# Patient Record
Sex: Male | Born: 1945 | Race: White | Hispanic: No | Marital: Married | Smoking: Never smoker
Health system: Southern US, Community
[De-identification: ages and names within clinical notes are randomized; demographics above are authoritative.]

## PROBLEM LIST (undated history)

## (undated) DIAGNOSIS — E291 Testicular hypofunction: Secondary | ICD-10-CM

## (undated) DIAGNOSIS — E669 Obesity, unspecified: Secondary | ICD-10-CM

## (undated) DIAGNOSIS — K219 Gastro-esophageal reflux disease without esophagitis: Secondary | ICD-10-CM

## (undated) DIAGNOSIS — I340 Nonrheumatic mitral (valve) insufficiency: Secondary | ICD-10-CM

## (undated) DIAGNOSIS — F1010 Alcohol abuse, uncomplicated: Secondary | ICD-10-CM

## (undated) DIAGNOSIS — K635 Polyp of colon: Secondary | ICD-10-CM

## (undated) DIAGNOSIS — I499 Cardiac arrhythmia, unspecified: Secondary | ICD-10-CM

## (undated) DIAGNOSIS — N529 Male erectile dysfunction, unspecified: Secondary | ICD-10-CM

## (undated) DIAGNOSIS — E7800 Pure hypercholesterolemia, unspecified: Secondary | ICD-10-CM

## (undated) DIAGNOSIS — I8390 Asymptomatic varicose veins of unspecified lower extremity: Secondary | ICD-10-CM

## (undated) DIAGNOSIS — M1990 Unspecified osteoarthritis, unspecified site: Secondary | ICD-10-CM

## (undated) DIAGNOSIS — K2970 Gastritis, unspecified, without bleeding: Secondary | ICD-10-CM

## (undated) DIAGNOSIS — I4811 Longstanding persistent atrial fibrillation: Secondary | ICD-10-CM

## (undated) DIAGNOSIS — I739 Peripheral vascular disease, unspecified: Secondary | ICD-10-CM

## (undated) HISTORY — PX: ENDOVENOUS ABLATION SAPHENOUS VEIN W/ LASER: SUR449

## (undated) HISTORY — DX: Gastro-esophageal reflux disease without esophagitis: K21.9

## (undated) HISTORY — PX: COLONOSCOPY: SHX174

## (undated) HISTORY — DX: Nonrheumatic mitral (valve) insufficiency: I34.0

## (undated) HISTORY — DX: Male erectile dysfunction, unspecified: N52.9

## (undated) HISTORY — DX: Pure hypercholesterolemia, unspecified: E78.00

## (undated) HISTORY — DX: Asymptomatic varicose veins of unspecified lower extremity: I83.90

## (undated) HISTORY — PX: CATARACT EXTRACTION: SUR2

## (undated) HISTORY — PX: EYE SURGERY: SHX253

## (undated) HISTORY — PX: OTHER SURGICAL HISTORY: SHX169

## (undated) HISTORY — DX: Longstanding persistent atrial fibrillation: I48.11

## (undated) HISTORY — PX: HERNIA REPAIR: SHX51

## (undated) SURGERY — Surgical Case
Anesthesia: *Unknown

---

## 2009-01-15 ENCOUNTER — Ambulatory Visit: Payer: Self-pay | Admitting: Gastroenterology

## 2010-11-12 DIAGNOSIS — K219 Gastro-esophageal reflux disease without esophagitis: Secondary | ICD-10-CM | POA: Insufficient documentation

## 2010-11-12 DIAGNOSIS — I482 Chronic atrial fibrillation, unspecified: Secondary | ICD-10-CM | POA: Insufficient documentation

## 2010-11-12 DIAGNOSIS — N529 Male erectile dysfunction, unspecified: Secondary | ICD-10-CM | POA: Insufficient documentation

## 2010-11-12 DIAGNOSIS — I340 Nonrheumatic mitral (valve) insufficiency: Secondary | ICD-10-CM | POA: Insufficient documentation

## 2010-11-12 DIAGNOSIS — E669 Obesity, unspecified: Secondary | ICD-10-CM | POA: Insufficient documentation

## 2010-11-12 DIAGNOSIS — M1990 Unspecified osteoarthritis, unspecified site: Secondary | ICD-10-CM | POA: Insufficient documentation

## 2010-11-12 DIAGNOSIS — I872 Venous insufficiency (chronic) (peripheral): Secondary | ICD-10-CM | POA: Insufficient documentation

## 2010-11-13 ENCOUNTER — Encounter: Payer: Self-pay | Admitting: *Deleted

## 2010-11-13 DIAGNOSIS — K219 Gastro-esophageal reflux disease without esophagitis: Secondary | ICD-10-CM | POA: Insufficient documentation

## 2010-11-13 DIAGNOSIS — I4819 Other persistent atrial fibrillation: Secondary | ICD-10-CM | POA: Insufficient documentation

## 2010-11-13 DIAGNOSIS — I83819 Varicose veins of unspecified lower extremities with pain: Secondary | ICD-10-CM | POA: Insufficient documentation

## 2010-11-13 DIAGNOSIS — E7800 Pure hypercholesterolemia, unspecified: Secondary | ICD-10-CM | POA: Insufficient documentation

## 2010-11-13 DIAGNOSIS — I340 Nonrheumatic mitral (valve) insufficiency: Secondary | ICD-10-CM | POA: Insufficient documentation

## 2010-11-13 DIAGNOSIS — N529 Male erectile dysfunction, unspecified: Secondary | ICD-10-CM | POA: Insufficient documentation

## 2010-12-10 DIAGNOSIS — E291 Testicular hypofunction: Secondary | ICD-10-CM | POA: Insufficient documentation

## 2010-12-11 ENCOUNTER — Ambulatory Visit (INDEPENDENT_AMBULATORY_CARE_PROVIDER_SITE_OTHER): Payer: Self-pay | Admitting: Cardiovascular Disease

## 2010-12-11 ENCOUNTER — Telehealth: Payer: Self-pay | Admitting: Cardiovascular Disease

## 2010-12-11 ENCOUNTER — Encounter: Payer: Self-pay | Admitting: Cardiovascular Disease

## 2010-12-11 ENCOUNTER — Other Ambulatory Visit: Payer: Self-pay | Admitting: *Deleted

## 2010-12-11 DIAGNOSIS — E7800 Pure hypercholesterolemia, unspecified: Secondary | ICD-10-CM

## 2010-12-11 DIAGNOSIS — I4891 Unspecified atrial fibrillation: Secondary | ICD-10-CM

## 2010-12-11 DIAGNOSIS — I480 Paroxysmal atrial fibrillation: Secondary | ICD-10-CM

## 2010-12-11 LAB — LIPID PANEL
HDL: 55.8 mg/dL (ref >39.00)
Total CHOL/HDL Ratio: 3
Triglycerides: 69 mg/dL (ref 0.0–149.0)
VLDL: 13.8 mg/dL (ref 0.0–40.0)

## 2010-12-11 LAB — BASIC METABOLIC PANEL
CO2: 24 mEq/L (ref 19–32)
Calcium: 9.1 mg/dL (ref 8.4–10.5)
Chloride: 106 mEq/L (ref 96–112)
Potassium: 4 mEq/L (ref 3.5–5.1)
Sodium: 142 mEq/L (ref 135–145)

## 2010-12-11 LAB — HEPATIC FUNCTION PANEL
ALT: 32 U/L (ref 0–53)
AST: 24 U/L (ref 0–37)
Albumin: 3.7 g/dL (ref 3.5–5.2)
Alkaline Phosphatase: 75 U/L (ref 39–117)
Bilirubin, Direct: 0.1 mg/dL (ref 0.0–0.3)
Total Bilirubin: 0.9 mg/dL (ref 0.3–1.2)
Total Protein: 6.7 g/dL (ref 6.0–8.3)

## 2010-12-11 MED ORDER — ATORVASTATIN CALCIUM 40 MG PO TABS: 40.0000 mg | ORAL_TABLET | Freq: Every day | ORAL | Status: DC

## 2010-12-11 MED ORDER — METOPROLOL SUCCINATE ER 50 MG PO TB24: 50.0000 mg | ORAL_TABLET | Freq: Every day | ORAL | Status: DC

## 2010-12-11 MED ORDER — PROPRANOLOL HCL 10 MG PO TABS: 10.0000 mg | ORAL_TABLET | Freq: Three times a day (TID) | ORAL | Status: DC

## 2010-12-11 NOTE — Telephone Encounter (Signed)
Pt called. He said he was here this morning and was calling back The meds are:  Atorvastatin 40 mg he has 11 refills and is ok until 10/2011  Toprol 100mg  he said the mg has been reduced to 50 mg please verify He said he needs a year supply called to [facility]

## 2010-12-11 NOTE — Telephone Encounter (Signed)
Spoke with pt/ meds ordered.

## 2010-12-11 NOTE — Progress Notes (Signed)
Louis Fuller Date of Birth  1945/07/26 [facility] [address]    [address] [place], [place]  [address] [phone]  Fax  [phone]  History of Present Illness:  Louis Fuller is a 65 y.o. gentleman with a Hx of paroxysmal Atrial Fibrillation.  He notices it significant heart irregularities after he comes in from own grass or doing other yard work. He takes an extra half of of Toprol that seemed to help. He gets some extra exercise  Current Outpatient Prescriptions on File Prior to Visit  Medication Sig Dispense Refill  . meloxicam (MOBIC) 15 MG tablet Take 15 mg by mouth daily.        . metoprolol (TOPROL-XL) 50 MG 24 hr tablet Take 50 mg by mouth daily.        . Multiple Vitamin (MULTIVITAMIN) tablet Take 1 tablet by mouth daily.        [name] Kitchen omeprazole (PRILOSEC) 20 MG capsule Take 20 mg by mouth daily.        . Atorvastatin 20 mg  20 mg daily      . Tadalafil (CIALIS PO) Take by mouth. As needed         No Known Allergies  Past Medical History  Diagnosis Date  . Intermittent atrial fibrillation   . Mitral regurgitation   . Varicose veins   . ED (erectile dysfunction)   . GERD (gastroesophageal reflux disease)   . Hypercholesterolemia     No past surgical history on file.  History  Smoking status  . Never Smoker   Smokeless tobacco  . Not on file    History  Alcohol Use  . 0.0 oz/week    No family history on file.  Reviw of Systems:  Reviewed in the HPI.  All other systems are negative.  Physical Exam: BP 118/85  Pulse 68  Ht 5\' 11"  (1.803 m)  Wt 267 lb (121.11 kg)  BMI 37.24 kg/m2 The patient is alert and oriented x 3.  The mood and affect are normal.   Skin: warm and dry.  Color is normal.    HEENT:   [place]/ AT, normal carotids  Lungs: clear   Heart: Regular rate, occasional PVCs    Abdomen: + BS, no HSM, soft  Extremities:  No c/c/e  Neuro:  Non focal    ECG: NSR, occasional PVCs  Assessment / Plan:

## 2010-12-11 NOTE — Assessment & Plan Note (Addendum)
He seems to be feeling very well. He still has occasional episodes of palpitations which are either due to premature ventricular contractions or recent episodes of atrial fibrillation. We will prescribe him propranolol 10 mg to be taken on top of his Toprol-XL. He may take propranolol up to 4 times a day as needed.

## 2010-12-11 NOTE — Patient Instructions (Addendum)
Your physician wants you to follow-up in: 1 year You will receive a reminder letter in the mail two months in advance. If you don't receive a letter, please call our office to schedule the follow-up appointment.  Your physician recommends that you return for a FASTING lipid profile: 1 year and today  Your physician has recommended you make the following change in your medication:   1) use propranolol 10 mg for palpitations, may take one every 30 minutes up to 4 doses, side effect is low blood pressure/ dizziness.    CALL ME BACK @ [phone] AND UPDATE ME WITH YOUR CHOLESTEROL MED, [name] rn

## 2010-12-11 NOTE — Assessment & Plan Note (Signed)
We'll check fasting lab work today and again in one year when I see him again for an office visit.

## 2010-12-13 ENCOUNTER — Other Ambulatory Visit: Payer: Self-pay | Admitting: *Deleted

## 2011-08-21 ENCOUNTER — Telehealth: Payer: Self-pay | Admitting: Cardiovascular Disease

## 2011-08-21 NOTE — Telephone Encounter (Signed)
Patient is due for f/u in Nov. Called to see if he would like to come to the [place] office instead of [place] since he lives in [place].

## 2011-11-07 ENCOUNTER — Other Ambulatory Visit: Payer: Self-pay | Admitting: Cardiovascular Disease

## 2011-12-01 ENCOUNTER — Other Ambulatory Visit: Payer: Self-pay | Admitting: *Deleted

## 2011-12-01 MED ORDER — ATORVASTATIN CALCIUM 40 MG PO TABS: 40.0000 mg | ORAL_TABLET | Freq: Every day | ORAL | Status: DC

## 2011-12-01 NOTE — Telephone Encounter (Signed)
Fax Received. Refill Completed. Adelayde Minney Chowoe (R.M.A)   

## 2011-12-02 ENCOUNTER — Other Ambulatory Visit: Payer: Self-pay | Admitting: *Deleted

## 2011-12-02 NOTE — Telephone Encounter (Signed)
Opened in Error.

## 2011-12-08 ENCOUNTER — Telehealth: Payer: Self-pay | Admitting: Cardiovascular Disease

## 2011-12-08 NOTE — Telephone Encounter (Signed)
Pt needs refill of his cholesterol med, [facility] [address] [place] , they requested 8 days ago with no response, pt has one pill left, needs refill asap today

## 2011-12-09 MED ORDER — ATORVASTATIN CALCIUM 40 MG PO TABS: 40.0000 mg | ORAL_TABLET | Freq: Every day | ORAL | Status: DC

## 2011-12-09 NOTE — Telephone Encounter (Signed)
Pt needs appointment then refill can be made Fax Received. Refill Completed. Lynetta Tomczak Chowoe (R.M.A)   

## 2012-01-16 ENCOUNTER — Other Ambulatory Visit: Payer: Self-pay | Admitting: *Deleted

## 2012-02-05 ENCOUNTER — Other Ambulatory Visit: Payer: Self-pay | Admitting: *Deleted

## 2012-02-05 MED ORDER — ATORVASTATIN CALCIUM 40 MG PO TABS: 40.0000 mg | ORAL_TABLET | Freq: Every day | ORAL | Status: DC

## 2012-02-05 NOTE — Telephone Encounter (Signed)
NO REFILLS UNTIL APPOINTMENT Fax Received. Refill Completed. Louis Fuller (R.M.A)   

## 2012-02-17 ENCOUNTER — Ambulatory Visit: Payer: Commercial Indemnity | Admitting: Cardiovascular Disease

## 2012-03-04 ENCOUNTER — Ambulatory Visit (INDEPENDENT_AMBULATORY_CARE_PROVIDER_SITE_OTHER): Payer: PRIVATE HEALTH INSURANCE | Admitting: Cardiovascular Disease

## 2012-03-04 ENCOUNTER — Encounter: Payer: Self-pay | Admitting: Cardiovascular Disease

## 2012-03-04 VITALS — BP 145/77 | HR 65 | Ht 71.0 in | Wt 276.5 lb

## 2012-03-04 DIAGNOSIS — E7800 Pure hypercholesterolemia, unspecified: Secondary | ICD-10-CM

## 2012-03-04 DIAGNOSIS — I4891 Unspecified atrial fibrillation: Secondary | ICD-10-CM

## 2012-03-04 DIAGNOSIS — I480 Paroxysmal atrial fibrillation: Secondary | ICD-10-CM

## 2012-03-04 MED ORDER — METOPROLOL SUCCINATE ER 50 MG PO TB24: 50.0000 mg | ORAL_TABLET | Freq: Every day | ORAL | Status: DC

## 2012-03-04 MED ORDER — ATORVASTATIN CALCIUM 40 MG PO TABS: 40.0000 mg | ORAL_TABLET | Freq: Every day | ORAL | Status: DC

## 2012-03-04 NOTE — Assessment & Plan Note (Signed)
Felicity Pellegrini continues to have some occasional episodes of paroxysmal atrial fibrillation. He seemed to resolve fairly quickly especially if he takes propranolol. We'll continue with this strategy.

## 2012-03-04 NOTE — Assessment & Plan Note (Signed)
We'll check fasting labs tomorrow. I've encouraged him to work more on a good diet and exercise and weight loss program. He will continue with his current medications.

## 2012-03-04 NOTE — Patient Instructions (Addendum)
Your physician wants you to follow-up in: 1 year with Dr. [name]. You will receive a reminder letter in the mail two months in advance. If you don't receive a letter, please call our office to schedule the follow-up appointment.  Your physician recommends that you return for lab work in: tomorrow. Make sure you are fasting.

## 2012-03-04 NOTE — Progress Notes (Addendum)
Louis Fuller Date of Birth  1946/01/08 [facility] [address]    [address] [place], [place]  [address] [phone]  Fax  [phone]  Problem list: 1. Paroxysmal atrial fibrillation 2. Hypertension 3. Hyperlipidemia 4. Knee arthritis - needs knee replacement  History of Present Illness:  Louis Fuller is a 66 y.o. gentleman with a Hx of paroxysmal Atrial Fibrillation.  He notices it significant heart irregularities after he comes in from own grass or doing other yard work. He takes an extra half of of Toprol that seemed to help. He gets some extra exercise  March 04, 2012:  Louis Fuller is doing well.  He has some rare episodes of palpitations that clinically sound like atrial fib.  The episode lasted most of the night.   He takes propranolol on occasion for these episodes of A-Fib.  Current Outpatient Prescriptions on File Prior to Visit  Medication Sig Dispense Refill  . meloxicam (MOBIC) 15 MG tablet Take 15 mg by mouth daily.        . metoprolol (TOPROL-XL) 50 MG 24 hr tablet Take 50 mg by mouth daily.        . Multiple Vitamin (MULTIVITAMIN) tablet Take 1 tablet by mouth daily.        [name] Kitchen omeprazole (PRILOSEC) 20 MG capsule Take 20 mg by mouth daily.        . Atorvastatin 20 mg  20 mg daily      . Tadalafil (CIALIS PO) Take by mouth. As needed         No Known Allergies  Past Medical History  Diagnosis Date  . Intermittent atrial fibrillation   . Mitral regurgitation   . Varicose veins   . ED (erectile dysfunction)   . GERD (gastroesophageal reflux disease)   . Hypercholesterolemia     Past Surgical History  Procedure Date  . Cataract extraction     left    History  Smoking status  . Never Smoker   Smokeless tobacco  . Not on file    History  Alcohol Use  . 0.0 oz/week    History reviewed. No pertinent family history.  Reviw of Systems:  Reviewed in the HPI.  All other systems are negative.  Physical Exam: BP 145/77  Pulse 65  Ht 5\' 11"  (1.803  m)  Wt 276 lb 8 oz (125.42 kg)  BMI 38.56 kg/m2 The patient is alert and oriented x 3.  The mood and affect are normal.   Skin: warm and dry.  Color is normal.    HEENT:   [place]/ AT, normal carotids  Lungs: clear   Heart: Regular rate, occasional PVCs    Abdomen: + BS, no HSM, soft  Extremities:  No c/c/e  Neuro:  Non focal    ECG: FEb. 6, 2014:  NSR at 64, NS ST abnormalities in inferior leads.  Assessment / Plan:

## 2012-03-05 ENCOUNTER — Ambulatory Visit (INDEPENDENT_AMBULATORY_CARE_PROVIDER_SITE_OTHER): Payer: PRIVATE HEALTH INSURANCE

## 2012-03-05 DIAGNOSIS — I480 Paroxysmal atrial fibrillation: Secondary | ICD-10-CM

## 2012-03-05 DIAGNOSIS — I4891 Unspecified atrial fibrillation: Secondary | ICD-10-CM

## 2012-03-06 LAB — BASIC METABOLIC PANEL
Calcium: 9.1 mg/dL (ref 8.6–10.2)
Chloride: 103 mmol/L (ref 97–108)
GFR calc non Af Amer: 88 mL/min/{1.73_m2} (ref >59)
Potassium: 4.6 mmol/L (ref 3.5–5.2)
Sodium: 142 mmol/L (ref 134–144)

## 2012-03-06 LAB — LIPID PANEL
Cholesterol, Total: 162 mg/dL (ref 100–199)
Triglycerides: 94 mg/dL (ref 0–149)

## 2012-03-06 LAB — HEPATIC FUNCTION PANEL
ALT: 37 IU/L (ref 0–44)
AST: 23 IU/L (ref 0–40)
Alkaline Phosphatase: 93 IU/L (ref 39–117)
Bilirubin, Direct: 0.15 mg/dL (ref 0.00–0.40)
Total Bilirubin: 0.5 mg/dL (ref 0.0–1.2)

## 2012-03-17 ENCOUNTER — Other Ambulatory Visit: Payer: Self-pay | Admitting: *Deleted

## 2012-03-17 NOTE — Telephone Encounter (Signed)
[facility]ro[facility]ol [facility]

## 2012-03-19 ENCOUNTER — Other Ambulatory Visit: Payer: Self-pay

## 2012-03-19 MED ORDER — METOPROLOL SUCCINATE ER 50 MG PO TB24: ORAL_TABLET | ORAL | Status: DC

## 2012-06-04 ENCOUNTER — Other Ambulatory Visit: Payer: Self-pay | Admitting: *Deleted

## 2012-06-04 MED ORDER — ATORVASTATIN CALCIUM 40 MG PO TABS: 40.0000 mg | ORAL_TABLET | Freq: Every day | ORAL | Status: DC

## 2012-06-04 NOTE — Telephone Encounter (Signed)
Fax Received. Refill Completed. Louis Fuller (R.M.A)   

## 2012-06-14 DIAGNOSIS — E785 Hyperlipidemia, unspecified: Secondary | ICD-10-CM | POA: Insufficient documentation

## 2012-09-24 ENCOUNTER — Ambulatory Visit: Payer: Self-pay | Admitting: Orthopedic Surgery

## 2012-11-30 ENCOUNTER — Other Ambulatory Visit: Payer: Self-pay

## 2012-11-30 MED ORDER — ATORVASTATIN CALCIUM 40 MG PO TABS: 40.0000 mg | ORAL_TABLET | Freq: Every day | ORAL | Status: DC

## 2013-02-21 ENCOUNTER — Other Ambulatory Visit: Payer: Self-pay | Admitting: Cardiovascular Disease

## 2013-05-31 ENCOUNTER — Other Ambulatory Visit: Payer: Self-pay

## 2013-05-31 MED ORDER — ATORVASTATIN CALCIUM 40 MG PO TABS: 40.0000 mg | ORAL_TABLET | Freq: Every day | ORAL | Status: DC

## 2013-06-22 ENCOUNTER — Other Ambulatory Visit: Payer: Self-pay | Admitting: *Deleted

## 2013-06-22 MED ORDER — ATORVASTATIN CALCIUM 40 MG PO TABS: 40.0000 mg | ORAL_TABLET | Freq: Every day | ORAL | Status: DC

## 2013-06-22 NOTE — Telephone Encounter (Signed)
Patient made an appt. Needs a refill

## 2013-07-05 ENCOUNTER — Encounter: Payer: Self-pay | Admitting: Cardiovascular Disease

## 2013-07-05 ENCOUNTER — Encounter (INDEPENDENT_AMBULATORY_CARE_PROVIDER_SITE_OTHER): Payer: Self-pay

## 2013-07-05 ENCOUNTER — Ambulatory Visit (INDEPENDENT_AMBULATORY_CARE_PROVIDER_SITE_OTHER): Payer: Medicare Other | Admitting: Cardiovascular Disease

## 2013-07-05 VITALS — BP 118/90 | HR 101 | Ht 71.0 in | Wt 275.5 lb

## 2013-07-05 DIAGNOSIS — I4891 Unspecified atrial fibrillation: Secondary | ICD-10-CM

## 2013-07-05 DIAGNOSIS — R0602 Shortness of breath: Secondary | ICD-10-CM

## 2013-07-05 DIAGNOSIS — I480 Paroxysmal atrial fibrillation: Secondary | ICD-10-CM

## 2013-07-05 NOTE — Assessment & Plan Note (Addendum)
Louis Fuller Presents with atrial fibrillation today. He's having a little bit more shortness breath and has some ankle edema. Her worried that he may be having some palpitations related to his atrial fibrillation. He's had PAF for many years.  We will get an echocardiogram to assess his LV function.  Had long discussion about anticoagulation. We'll start him on Eliquis 5 bid.  I've given him the names of all of the different Novel anticoagulation agents and he will see which one his insurance company prefers.  We'll continue with rate control and anticoagulation for now. Will need to do a stress Myoview study before starting him on any additional medications. He may be a candidate for flecainide. I seen again in one month for followup office visit and EKG.

## 2013-07-05 NOTE — Progress Notes (Signed)
Louis Fuller Date of Birth  1945-03-03 [facility] [address]    [place], [place]  [address] [phone]  Fax  [phone]  Problem list: 1. Paroxysmal atrial fibrillation 2. Hypertension 3. Hyperlipidemia 4. Knee arthritis - needs knee replacement  History of Present Illness:  Louis Fuller is a 68 y.o. gentleman with a Hx of paroxysmal Atrial Fibrillation.  He notices it significant heart irregularities after he comes in from own grass or doing other yard work. He takes an extra half of of Toprol that seemed to help. He gets some extra exercise  March 04, 2012:  Louis Fuller is doing well.  He has some rare episodes of palpitations that clinically sound like atrial fib.  The episode lasted most of the night.   He takes propranolol on occasion for these episodes of A-Fib.  July 05, 2013:  He has moved to [place] since I last saw him.    He needs to have knee replacements.   He has vericose veins in his legs and has leg swelling.    No CP, no dyspnea.   He is in A-fib today at his office visit.   Current Outpatient Prescriptions on File Prior to Visit  Medication Sig Dispense Refill  . meloxicam (MOBIC) 15 MG tablet Take 15 mg by mouth daily.        . metoprolol (TOPROL-XL) 50 MG 24 hr tablet Take 50 mg by mouth daily.        . Multiple Vitamin (MULTIVITAMIN) tablet Take 1 tablet by mouth daily.        [name] Kitchen omeprazole (PRILOSEC) 20 MG capsule Take 20 mg by mouth daily.        . Atorvastatin 20 mg  20 mg daily      . Tadalafil (CIALIS PO) Take by mouth. As needed         No Known Allergies  Past Medical History  Diagnosis Date  . Intermittent atrial fibrillation   . Mitral regurgitation   . Varicose veins   . ED (erectile dysfunction)   . GERD (gastroesophageal reflux disease)   . Hypercholesterolemia     Past Surgical History  Procedure Laterality Date  . Cataract extraction      left  . Vein laser surgery      History  Smoking status  . Never  Smoker   Smokeless tobacco  . Not on file    History  Alcohol Use  . 0.0 oz/week    Family History  Problem Relation Age of Onset  . Family history unknown: Yes    Reviw of Systems:  Reviewed in the HPI.  All other systems are negative.  Physical Exam: BP 118/90  Pulse 101  Ht 5\' 11"  (1.803 m)  Wt 275 lb 8 oz (124.966 kg)  BMI 38.44 kg/m2 The patient is alert and oriented x 3.  The mood and affect are normal.   Skin: warm and dry.  Color is normal.    HEENT:   [place]/ AT, normal carotids  Lungs: clear   Heart: irreg. Irreg. , occasional PVCs    Abdomen: + BS, no HSM, soft  Extremities:  No c/c/e  Neuro:  Non focal    ECG: FEb. 6, 2014:  NSR at 64, NS ST abnormalities in inferior leads.  Assessment / Plan:

## 2013-07-05 NOTE — Patient Instructions (Addendum)
Check the price of the following anticoagulants:  Pradaxa 150 mg twice a day Xarelto 20 mg a day Eliquis 5 mg twice a day Savaysa 60 mg a day.    Eliquis 5 mg take one twice a day  Samples given   Your physician has requested that you have an echocardiogram. Echocardiography is a painless test that uses sound waves to create images of your heart. It provides your doctor with information about the size and shape of your heart and how well your heart's chambers and valves are working. This procedure takes approximately one hour. There are no restrictions for this procedure.  Your physician recommends that you schedule a follow-up appointment in: 1 month with a EKG

## 2013-07-06 ENCOUNTER — Telehealth: Payer: Self-pay

## 2013-07-06 DIAGNOSIS — R0602 Shortness of breath: Secondary | ICD-10-CM

## 2013-07-06 DIAGNOSIS — I480 Paroxysmal atrial fibrillation: Secondary | ICD-10-CM

## 2013-07-06 NOTE — Telephone Encounter (Signed)
Pt calling back to let know that blood thinners covered by ins are Eliquis, Savaysa, and Xarelto. Please call. States [facility] is a "little bit cheaper, and Eliquis he would have to take twice a day, not sure if he would remember to take this twice a day. " [facility].

## 2013-07-07 NOTE — Telephone Encounter (Signed)
I have calculated his creatinine clearance and his renal function is so good that his creatinine clearance exceeds the limit for Savaysa.  ( should not be used with CrCl over 95)   Would use Eliquis 5 mg BID

## 2013-07-08 MED ORDER — APIXABAN 5 MG PO TABS
5.0000 mg | ORAL_TABLET | Freq: Two times a day (BID) | ORAL | Status: DC
Start: 2013-07-08 — End: 2014-02-09

## 2013-07-08 NOTE — Telephone Encounter (Signed)
Patient has decided to take the Eliquis 5 mg BID  New RX sent to mail order pharmacy  Discount cards left at front desk  Aspirin stopped per Dr. Acie Fredrickson

## 2013-07-21 ENCOUNTER — Other Ambulatory Visit (INDEPENDENT_AMBULATORY_CARE_PROVIDER_SITE_OTHER): Payer: Medicare Other

## 2013-07-21 ENCOUNTER — Other Ambulatory Visit: Payer: Self-pay

## 2013-07-21 DIAGNOSIS — I480 Paroxysmal atrial fibrillation: Secondary | ICD-10-CM

## 2013-07-21 DIAGNOSIS — I4891 Unspecified atrial fibrillation: Secondary | ICD-10-CM

## 2013-07-21 DIAGNOSIS — R0602 Shortness of breath: Secondary | ICD-10-CM

## 2013-07-25 ENCOUNTER — Telehealth: Payer: Self-pay

## 2013-07-25 NOTE — Telephone Encounter (Signed)
Reviewed results w/ pt.  He verbalizes understanding and will keep appt on 08/03/13 w/ Dr. Acie Fredrickson.

## 2013-07-25 NOTE — Telephone Encounter (Signed)
Notes Recorded by Thayer Headings, MD on 07/22/2013 at 2:28 PM Overall , the echo looks great. Trivial valve issues ( nothing to worry about). Normal LV function              Results 2D Echocardiogram without contrast (Order [id number])         2D Echocardiogram without contrast   Status: Final result Visible to patient: This result is not viewable by the patient. Next appt: 08/03/2013 at 11:15 AM in Cardiology Darden Amber., MD) Dx: SOB (shortness of breath); Intermitte...         Notes Recorded by Thayer Headings, MD on 07/22/2013 at 2:28 PM Overall , the echo looks great. Trivial valve issues ( nothing to worry about). Normal LV function      Can you please review Echo and contact patient. Thanks!

## 2013-07-25 NOTE — Progress Notes (Signed)
Please contact patient regarding Echo.

## 2013-08-03 ENCOUNTER — Ambulatory Visit (INDEPENDENT_AMBULATORY_CARE_PROVIDER_SITE_OTHER): Payer: Medicare Other | Admitting: Cardiovascular Disease

## 2013-08-03 ENCOUNTER — Encounter: Payer: Self-pay | Admitting: Cardiovascular Disease

## 2013-08-03 VITALS — BP 124/102 | HR 88 | Ht 71.0 in | Wt 274.8 lb

## 2013-08-03 DIAGNOSIS — I480 Paroxysmal atrial fibrillation: Secondary | ICD-10-CM

## 2013-08-03 DIAGNOSIS — I4891 Unspecified atrial fibrillation: Secondary | ICD-10-CM

## 2013-08-03 MED ORDER — PROPRANOLOL HCL 10 MG PO TABS: ORAL_TABLET | ORAL | Status: DC

## 2013-08-03 MED ORDER — FLECAINIDE ACETATE 50 MG PO TABS: 50.0000 mg | ORAL_TABLET | Freq: Two times a day (BID) | ORAL | Status: DC

## 2013-08-03 NOTE — Patient Instructions (Addendum)
Louis Fuller  Your caregiver has ordered a Stress Test with nuclear imaging. The purpose of this test is to evaluate the blood supply to your heart muscle. This procedure is referred to as a "Non-Invasive Stress Test." This is because other than having an IV started in your vein, nothing is inserted or "invades" your body. Cardiac stress tests are done to find areas of poor blood flow to the heart by determining the extent of coronary artery disease (CAD). Some patients exercise on a treadmill, which naturally increases the blood flow to your heart, while others who are  unable to walk on a treadmill due to physical limitations have a pharmacologic/chemical stress agent called Lexiscan . This medicine will mimic walking on a treadmill by temporarily increasing your coronary blood flow.   Please note: these test may take anywhere between 2-4 hours to complete  PLEASE REPORT TO [facility] AT THE FIRST DESK WILL DIRECT YOU WHERE TO GO  Date of Procedure:_____________07/20/15________________________  Arrival Time for Procedure:__________0715 am____________________  PLEASE NOTIFY THE OFFICE AT LEAST 24 HOURS IN ADVANCE IF YOU ARE UNABLE TO KEEP YOUR APPOINTMENT.  [phone] AND  PLEASE NOTIFY NUCLEAR MEDICINE AT [facility] AT LEAST 24 HOURS IN ADVANCE IF YOU ARE UNABLE TO KEEP YOUR APPOINTMENT. [phone]  How to prepare for your Myoview test:  1. Do not eat or drink after midnight 2. No caffeine for 24 hours prior to test 3. No smoking 24 hours prior to test. 4. Your medication may be taken with water.  If your doctor stopped a medication because of this test, do not take that medication. 5. Ladies, please do not wear dresses.  Skirts or pants are appropriate. Please wear a short sleeve shirt. 6. No perfume, cologne or lotion. 7. Wear comfortable walking shoes. No heels!   Your physician has recommended you make the following change in your medication:  Start  Propranolol 10 mg four times daily AS NEEDED Start Flecainide 50 mg twice daily   Your physician recommends that you schedule a follow-up appointment in:  Dr. Acie Fredrickson on 08/23/13. You will need a EKG during this visit.

## 2013-08-03 NOTE — Assessment & Plan Note (Signed)
Has had fairly persistent atrial fibrillation since last saw. We'll start him on low-dose flecainide 50 mg twice a day.  We'll order a [employer] study.  I will see him back in several weeks for OV and ECG.  We may attempt cardioversion in the next several weeks.

## 2013-08-03 NOTE — Progress Notes (Signed)
Louis Fuller Date of Birth  Aug 16, 1945 [facility] [address]    [place], [place]  [address] [phone]  Fax  [phone]  Problem list: 1. Paroxysmal atrial fibrillation 2. Hypertension 3. Hyperlipidemia 4. Knee arthritis - needs knee replacement  History of Present Illness:  Louis Fuller is a 68 y.o. gentleman with a Hx of paroxysmal Atrial Fibrillation.  He notices it significant heart irregularities after he comes in from own grass or doing other yard work. He takes an extra half of of Toprol that seemed to help. He gets some extra exercise  March 04, 2012:  Louis Fuller is doing well.  He has some rare episodes of palpitations that clinically sound like atrial fib.  The episode lasted most of the night.   He takes propranolol on occasion for these episodes of A-Fib.  July 05, 2013:  He has moved to [place] since I last saw him.    He needs to have knee replacements.   He has vericose veins in his legs and has leg swelling.    No CP, no dyspnea.   He is in A-fib today at his office visit.   August 03, 2013:   He is still in atrial fib.  He is basically asymptomatic.  He's able to do all of his normal activities without any problems. He denies any chest pain or shortness of breath.  He has not had his myoview yet.   Echo revealed. Normal left ventricular systolic function and mild mitral regurgitation, trivial aortic insufficiency.     Current Outpatient Prescriptions on File Prior to Visit  Medication Sig Dispense Refill  . meloxicam (MOBIC) 15 MG tablet Take 15 mg by mouth daily.        . metoprolol (TOPROL-XL) 50 MG 24 hr tablet Take 50 mg by mouth daily.        . Multiple Vitamin (MULTIVITAMIN) tablet Take 1 tablet by mouth daily.        [name] Kitchen omeprazole (PRILOSEC) 20 MG capsule Take 20 mg by mouth daily.        . Atorvastatin 20 mg  20 mg daily      . Tadalafil (CIALIS PO) Take by mouth. As needed         No Known Allergies  Past Medical History   Diagnosis Date  . Intermittent atrial fibrillation   . Mitral regurgitation   . Varicose veins   . ED (erectile dysfunction)   . GERD (gastroesophageal reflux disease)   . Hypercholesterolemia     Past Surgical History  Procedure Laterality Date  . Cataract extraction      left  . Vein laser surgery      History  Smoking status  . Never Smoker   Smokeless tobacco  . Not on file    History  Alcohol Use  . 0.0 oz/week    History reviewed. No pertinent family history.  Reviw of Systems:  Reviewed in the HPI.  All other systems are negative.  Physical Exam: BP 124/102  Pulse 88  Ht 5\' 11"  (1.803 m)  Wt 274 lb 12 oz (124.626 kg)  BMI 38.34 kg/m2 The patient is alert and oriented x 3.  The mood and affect are normal.   Skin: warm and dry.  Color is normal.    HEENT:   [place]/ AT, normal carotids  Lungs: clear   Heart: irreg. Irreg. ,     Abdomen: + BS, no HSM, soft  Extremities:  No c/c/e  Neuro:  Non focal    ECG: August 03, 2013:  Atrial fib with rate of 88.  QTC 395.   Assessment / Plan:

## 2013-08-15 ENCOUNTER — Ambulatory Visit: Payer: Self-pay

## 2013-08-15 DIAGNOSIS — I4891 Unspecified atrial fibrillation: Secondary | ICD-10-CM | POA: Diagnosis not present

## 2013-08-16 ENCOUNTER — Other Ambulatory Visit: Payer: Self-pay

## 2013-08-16 DIAGNOSIS — I4891 Unspecified atrial fibrillation: Secondary | ICD-10-CM

## 2013-08-23 ENCOUNTER — Ambulatory Visit (INDEPENDENT_AMBULATORY_CARE_PROVIDER_SITE_OTHER): Payer: Medicare Other | Admitting: Cardiovascular Disease

## 2013-08-23 ENCOUNTER — Encounter: Payer: Self-pay | Admitting: Cardiovascular Disease

## 2013-08-23 VITALS — BP 110/90 | HR 78 | Ht 71.0 in | Wt 274.2 lb

## 2013-08-23 DIAGNOSIS — I480 Paroxysmal atrial fibrillation: Secondary | ICD-10-CM

## 2013-08-23 DIAGNOSIS — I4891 Unspecified atrial fibrillation: Secondary | ICD-10-CM

## 2013-08-23 MED ORDER — FLECAINIDE ACETATE 100 MG PO TABS: 100.0000 mg | ORAL_TABLET | Freq: Two times a day (BID) | ORAL | Status: DC

## 2013-08-23 NOTE — Progress Notes (Signed)
Louis Fuller Date of Birth  [birth year] [facility] [address]    [place], [place]  [address] [phone]  Fax  [phone]  Problem list: 1. Paroxysmal atrial fibrillation 2. Hypertension 3. Hyperlipidemia 4. Knee arthritis - needs knee replacement  History of Present Illness:  Louis Fuller is a 68 y.o. gentleman with a Hx of paroxysmal Atrial Fibrillation.  He notices it significant heart irregularities after he comes in from own grass or doing other yard work. He takes an extra half of of Toprol that seemed to help. He gets some extra exercise  March 04, 2012:  Louis Fuller is doing well.  He has some rare episodes of palpitations that clinically sound like atrial fib.  The episode lasted most of the night.   He takes propranolol on occasion for these episodes of A-Fib.  July 05, 2013:  He has moved to [place] since I last saw him.    He needs to have knee replacements.   He has vericose veins in his legs and has leg swelling.    No CP, no dyspnea.   He is in A-fib today at his office visit.   August 03, 2013:   He is still in atrial fib.  He is basically asymptomatic.  He's able to do all of his normal activities without any problems. He denies any chest pain or shortness of breath.  He has not had his myoview yet.   Echo revealed. Normal left ventricular systolic function and mild mitral regurgitation, trivial aortic insufficiency.    August 23, 2013:  Louis Fuller is seen back - he is still A. symptomatically and basically cannot tell that is in atrial fibrillation. He remains in atrial fibrillation. He's able to do all his normal activities without any issues.  He's tolerating the Flecainide well. He's on Eliquis and has not noted any bleeding.   Current Outpatient Prescriptions on File Prior to Visit  Medication Sig Dispense Refill  . meloxicam (MOBIC) 15 MG tablet Take 15 mg by mouth daily.        . metoprolol (TOPROL-XL) 50 MG 24 hr tablet Take 50 mg by mouth  daily.        . Multiple Vitamin (MULTIVITAMIN) tablet Take 1 tablet by mouth daily.        [name] Kitchen omeprazole (PRILOSEC) 20 MG capsule Take 20 mg by mouth daily.        . Atorvastatin 20 mg  20 mg daily      . Tadalafil (CIALIS PO) Take by mouth. As needed         Not on File  Past Medical History  Diagnosis Date  . Intermittent atrial fibrillation   . Mitral regurgitation   . Varicose veins   . ED (erectile dysfunction)   . GERD (gastroesophageal reflux disease)   . Hypercholesterolemia     Past Surgical History  Procedure Laterality Date  . Cataract extraction      left  . Vein laser surgery    . Cataract extraction      History  Smoking status  . Never Smoker   Smokeless tobacco  . Not on file    History  Alcohol Use  . 0.0 oz/week    History reviewed. No pertinent family history.  Reviw of Systems:  Reviewed in the HPI.  All other systems are negative.  Physical Exam: BP 102/92  Pulse 78  Ht 5\' 11"  (1.803 m)  Wt 274 lb 4 oz (124.399 kg)  BMI 38.27  kg/m2 The patient is alert and oriented x 3.  The mood and affect are normal.   Skin: warm and dry.  Color is normal.    HEENT:   [place]/ AT, normal carotids  Lungs: clear   Heart: irreg. Irreg. ,     Abdomen: + BS, no HSM, soft  Extremities:  1-2+ leg edema  Neuro:  Non focal    ECG: August 23, 2013:  Atrial fib with V rate of 78.  No ST or T wave changes.    Assessment / Plan:

## 2013-08-23 NOTE — Assessment & Plan Note (Signed)
Take continues to have atrial fibrillation. We will increase the flecainide 100 mg twice a day. We discussed to do cardioversion he would like to wait and see if the increased dose of flecainide converts him   I will see him in 1 month .   He's fairly asymptomatic so we may end up choosing anticoagulation plus rate control. He does have some chronic leg edema but this does not appear to be any different than he was prior to having atrial fib.

## 2013-08-23 NOTE — Patient Instructions (Signed)
Your physician has recommended you make the following change in your medication:  Increase Flecainide to 100 mg twice a day   Your physician wants you to follow-up in: 1 month. You will receive a reminder letter in the mail two months in advance. If you don't receive a letter, please call our office to schedule the follow-up appointment.

## 2013-09-27 ENCOUNTER — Encounter: Payer: Self-pay | Admitting: Cardiovascular Disease

## 2013-09-27 ENCOUNTER — Ambulatory Visit (INDEPENDENT_AMBULATORY_CARE_PROVIDER_SITE_OTHER): Payer: Medicare Other | Admitting: Cardiovascular Disease

## 2013-09-27 ENCOUNTER — Encounter: Payer: Self-pay | Admitting: *Deleted

## 2013-09-27 VITALS — BP 128/90 | HR 81 | Ht 71.0 in | Wt 276.8 lb

## 2013-09-27 DIAGNOSIS — I4891 Unspecified atrial fibrillation: Secondary | ICD-10-CM

## 2013-09-27 DIAGNOSIS — I4819 Other persistent atrial fibrillation: Secondary | ICD-10-CM

## 2013-09-27 NOTE — Assessment & Plan Note (Signed)
Louis Fuller is doing well. His rate is fairly well-controlled. Will schedule him for cardioversion at [facility]. He has been instructed to continued taking his meds including his Eliquis.

## 2013-09-27 NOTE — Progress Notes (Signed)
Louis Fuller Date of Birth  03/11/1945 [facility] [address]    [place], [place]  [address] [phone]  Fax  [phone]  Problem list: 1. Paroxysmal atrial fibrillation 2. Hypertension 3. Hyperlipidemia 4. Knee arthritis - needs knee replacement  History of Present Illness:  Louis Fuller is a 68 y.o. gentleman with a Hx of paroxysmal Atrial Fibrillation.  He notices it significant heart irregularities after he comes in from own grass or doing other yard work. He takes an extra half of of Toprol that seemed to help. He gets some extra exercise  March 04, 2012:  Louis Fuller is doing well.  He has some rare episodes of palpitations that clinically sound like atrial fib.  The episode lasted most of the night.   He takes propranolol on occasion for these episodes of A-Fib.  July 05, 2013:  He has moved to [place] since I last saw him.    He needs to have knee replacements.   He has vericose veins in his legs and has leg swelling.    No CP, no dyspnea.   He is in A-fib today at his office visit.   August 03, 2013:   He is still in atrial fib.  He is basically asymptomatic.  He's able to do all of his normal activities without any problems. He denies any chest pain or shortness of breath.  He has not had his myoview yet.   Echo revealed. Normal left ventricular systolic function and mild mitral regurgitation, trivial aortic insufficiency.    August 23, 2013:  Louis Fuller is seen back - he is still A. symptomatically and basically cannot tell that is in atrial fibrillation. He remains in atrial fibrillation. He's able to do all his normal activities without any issues.  He's tolerating the Flecainide well. He's on Eliquis and has not noted any bleeding.  Sept. 1, 2015:  Louis Fuller is seen  - doing well with current meds.   Ready to have a cardioversion scheduled.   Current Outpatient Prescriptions on File Prior to Visit  Medication Sig Dispense Refill  . meloxicam (MOBIC) 15 MG  tablet Take 15 mg by mouth daily.        . metoprolol (TOPROL-XL) 50 MG 24 hr tablet Take 50 mg by mouth daily.        . Multiple Vitamin (MULTIVITAMIN) tablet Take 1 tablet by mouth daily.        [name] Kitchen omeprazole (PRILOSEC) 20 MG capsule Take 20 mg by mouth daily.        . Atorvastatin 20 mg  20 mg daily      . Tadalafil (CIALIS PO) Take by mouth. As needed         No Known Allergies  Past Medical History  Diagnosis Date  . Intermittent atrial fibrillation   . Mitral regurgitation   . Varicose veins   . ED (erectile dysfunction)   . GERD (gastroesophageal reflux disease)   . Hypercholesterolemia     Past Surgical History  Procedure Laterality Date  . Cataract extraction      left  . Vein laser surgery    . Cataract extraction      History  Smoking status  . Never Smoker   Smokeless tobacco  . Not on file    History  Alcohol Use  . 0.0 oz/week    Family History  Problem Relation Age of Onset  . Family history unknown: Yes    Reviw of Systems:  Reviewed in  the HPI.  All other systems are negative.  Physical Exam: BP 128/90  Pulse 81  Ht 5\' 11"  (1.803 m)  Wt 276 lb 12 oz (125.533 kg)  BMI 38.62 kg/m2 The patient is alert and oriented x 3.  The mood and affect are normal.   Skin: warm and dry.  Color is normal.    HEENT:   / AT, normal carotids  Lungs: clear   Heart: irreg. Irreg. ,     Abdomen: + BS, no HSM, soft  Extremities:  1-2+ leg edema  Neuro:  Non focal    ECG: 09/27/2013: Atrial fibrillation with a ventricular rate of 81. Has no ST or T wave changes.  Assessment / Plan:

## 2013-09-27 NOTE — Patient Instructions (Signed)
Your physician has recommended that you have a Cardioversion (DCCV). Electrical Cardioversion uses a jolt of electricity to your heart either through paddles or wired patches attached to your chest. This is a controlled, usually prescheduled, procedure. Defibrillation is done under light anesthesia in the hospital, and you usually go home the day of the procedure. This is done to get your heart back into a normal rhythm. You are not awake for the procedure. Please see the instruction sheet given to you today.  Please follow attached instruction letter   Your physician recommends that you schedule a follow-up appointment in: 2 months with Dr. Acie Fredrickson

## 2013-09-28 ENCOUNTER — Encounter (HOSPITAL_COMMUNITY): Payer: Self-pay | Admitting: Pharmacy Technician

## 2013-09-28 LAB — CBC WITH DIFFERENTIAL
BASOS ABS: 0 10*3/uL (ref 0.0–0.2)
Basos: 0 %
Eos: 2 %
Eosinophils Absolute: 0.2 10*3/uL (ref 0.0–0.4)
HCT: 43.6 % (ref 37.5–51.0)
Hemoglobin: 14.9 g/dL (ref 12.6–17.7)
Immature Grans (Abs): 0 10*3/uL (ref 0.0–0.1)
Immature Granulocytes: 0 %
LYMPHS ABS: 2.4 10*3/uL (ref 0.7–3.1)
LYMPHS: 34 %
MCH: 32.5 pg (ref 26.6–33.0)
MCHC: 34.2 g/dL (ref 31.5–35.7)
MCV: 95 fL (ref 79–97)
Monocytes Absolute: 0.7 10*3/uL (ref 0.1–0.9)
Monocytes: 10 %
Neutrophils Absolute: 3.9 10*3/uL (ref 1.4–7.0)
Neutrophils Relative %: 54 %
Platelets: 192 10*3/uL (ref 150–379)
RBC: 4.59 x10E6/uL (ref 4.14–5.80)
RDW: 13.6 % (ref 12.3–15.4)
WBC: 7.1 10*3/uL (ref 3.4–10.8)

## 2013-09-28 LAB — PROTIME-INR
INR: 1 (ref 0.8–1.2)
Prothrombin Time: 10.8 s (ref 9.1–12.0)

## 2013-09-28 LAB — BASIC METABOLIC PANEL
BUN / CREAT RATIO: 21 (ref 10–22)
BUN: 20 mg/dL (ref 8–27)
CALCIUM: 8.9 mg/dL (ref 8.6–10.2)
CO2: 20 mmol/L (ref 18–29)
Chloride: 101 mmol/L (ref 97–108)
Creatinine, Ser: 0.97 mg/dL (ref 0.76–1.27)
GFR calc Af Amer: 94 mL/min/{1.73_m2} (ref >59)
GFR calc non Af Amer: 82 mL/min/{1.73_m2} (ref >59)
Glucose: 83 mg/dL (ref 65–99)
POTASSIUM: 4.5 mmol/L (ref 3.5–5.2)
SODIUM: 140 mmol/L (ref 134–144)

## 2013-10-06 ENCOUNTER — Encounter (HOSPITAL_COMMUNITY): Payer: Medicare Other | Admitting: Anesthesiology

## 2013-10-06 ENCOUNTER — Encounter (HOSPITAL_COMMUNITY): Payer: Self-pay

## 2013-10-06 ENCOUNTER — Other Ambulatory Visit: Payer: Self-pay | Admitting: Cardiovascular Disease

## 2013-10-06 ENCOUNTER — Ambulatory Visit (HOSPITAL_COMMUNITY)
Admission: RE | Admit: 2013-10-06 | Discharge: 2013-10-06 | Disposition: A | Discharge status: Home / Self Care | Payer: Medicare Other | Source: Ambulatory Visit | Attending: Cardiovascular Disease | Admitting: Cardiovascular Disease

## 2013-10-06 ENCOUNTER — Ambulatory Visit (HOSPITAL_COMMUNITY): Payer: Medicare Other | Admitting: Anesthesiology

## 2013-10-06 ENCOUNTER — Encounter (HOSPITAL_COMMUNITY)
Admission: RE | Disposition: A | Discharge status: Home / Self Care | Payer: Self-pay | Source: Ambulatory Visit | Attending: Cardiovascular Disease

## 2013-10-06 DIAGNOSIS — I10 Essential (primary) hypertension: Secondary | ICD-10-CM | POA: Diagnosis not present

## 2013-10-06 DIAGNOSIS — I4891 Unspecified atrial fibrillation: Secondary | ICD-10-CM | POA: Insufficient documentation

## 2013-10-06 DIAGNOSIS — I480 Paroxysmal atrial fibrillation: Secondary | ICD-10-CM

## 2013-10-06 HISTORY — PX: CARDIOVERSION: SHX1299

## 2013-10-06 SURGERY — CARDIOVERSION
Anesthesia: Monitor Anesthesia Care

## 2013-10-06 MED ORDER — PROPOFOL 10 MG/ML IV BOLUS
INTRAVENOUS | Status: DC | PRN
  Administered 2013-10-06: 30 mg via INTRAVENOUS
  Administered 2013-10-06: 90 mg via INTRAVENOUS

## 2013-10-06 MED ORDER — LIDOCAINE HCL (CARDIAC) 20 MG/ML IV SOLN
INTRAVENOUS | Status: DC | PRN
  Administered 2013-10-06: 60 mg via INTRAVENOUS

## 2013-10-06 MED ORDER — SODIUM CHLORIDE 0.9 % IV SOLN
INTRAVENOUS | Status: DC
  Administered 2013-10-06: 10:00:00 via INTRAVENOUS

## 2013-10-06 NOTE — Transfer of Care (Signed)
Immediate Anesthesia Transfer of Care Note  Patient: Louis Fuller  Procedure(s) Performed: Procedure(s): CARDIOVERSION (N/A)  Patient Location: PACU and Endoscopy Unit  Anesthesia Type:General  Level of Consciousness: awake and patient cooperative  Airway & Oxygen Therapy: Patient Spontanous Breathing and Patient connected to nasal cannula oxygen  Post-op Assessment: Report given to PACU RN, Post -op Vital signs reviewed and stable and Patient moving all extremities  Post vital signs: Reviewed and stable  Complications: No apparent anesthesia complications

## 2013-10-06 NOTE — Procedures (Signed)
Electrical Cardioversion Procedure Note Louis Fuller [id number] [birth year]  Procedure: Electrical Cardioversion Indications:  Atrial Fibrillation  Procedure Details Consent: Risks of procedure as well as the alternatives and risks of each were explained to the (patient/caregiver).  Consent for procedure obtained. Time Out: Verified patient identification, verified procedure, site/side was marked, verified correct patient position, special equipment/implants available, medications/allergies/relevent history reviewed, required imaging and test results available.  Performed  Patient placed on cardiac monitor, pulse oximetry, supplemental oxygen as necessary.  Sedation given: Patient sedated by anesthesia with lidocaine 60 mg and diprovan 90 mg IV. Pacer pads placed anterior and posterior chest.  Cardioverted 2 time(s).  Cardioverted at 120J; unsuccessful; second attempt with 150J resulted in sinus rhythm.  Evaluation Findings: Post procedure EKG shows: NSR Complications: None Patient did tolerate procedure well.   Kirk Ruths 10/06/2013, 11:10 AM

## 2013-10-06 NOTE — Discharge Instructions (Signed)

## 2013-10-06 NOTE — Anesthesia Postprocedure Evaluation (Signed)
  Anesthesia Post-op Note  Patient: Louis Fuller  Procedure(s) Performed: Procedure(s): CARDIOVERSION (N/A)  Patient Location: PACU and Endoscopy Unit  Anesthesia Type:General  Level of Consciousness: awake and patient cooperative  Airway and Oxygen Therapy: Patient Spontanous Breathing and Patient connected to nasal cannula oxygen  Post-op Pain: none  Post-op Assessment: Post-op Vital signs reviewed, Patient's Cardiovascular Status Stable, Respiratory Function Stable, Patent Airway and No signs of Nausea or vomiting  Post-op Vital Signs: Reviewed and stable  Last Vitals:  Filed Vitals:   10/06/13 1126  BP: 127/90  Pulse: 59  Temp:   Resp: 16    Complications: No apparent anesthesia complications

## 2013-10-06 NOTE — Anesthesia Preprocedure Evaluation (Addendum)
Anesthesia Evaluation  Patient identified by MRN, date of birth, ID band Patient awake    Reviewed: Allergy & Precautions, H&P , NPO status , Patient's Chart, lab work & pertinent test results, reviewed documented beta blocker date and time   Airway Mallampati: II TM Distance: >3 FB Neck ROM: Full    Dental  (+) Teeth Intact   Pulmonary          Cardiovascular hypertension, Pt. on home beta blockers     Neuro/Psych    GI/Hepatic   Endo/Other    Renal/GU      Musculoskeletal   Abdominal   Peds  Hematology   Anesthesia Other Findings   Reproductive/Obstetrics                           Anesthesia Physical Anesthesia Plan  ASA: III  Anesthesia Plan:    Post-op Pain Management:    Induction:   Airway Management Planned:   Additional Equipment:   Intra-op Plan:   Post-operative Plan:   Informed Consent:   Plan Discussed with:   Anesthesia Plan Comments:         Anesthesia Quick Evaluation

## 2013-10-07 ENCOUNTER — Encounter (HOSPITAL_COMMUNITY): Payer: Self-pay | Admitting: Cardiology

## 2013-10-12 NOTE — Addendum Note (Signed)
Addendum created 10/12/13 0925 by Laurie Panda, MD   Modules edited: Anesthesia Attestations

## 2013-10-14 ENCOUNTER — Other Ambulatory Visit: Payer: Self-pay

## 2013-10-14 MED ORDER — ATORVASTATIN CALCIUM 40 MG PO TABS: 40.0000 mg | ORAL_TABLET | Freq: Every day | ORAL | Status: DC

## 2013-11-29 ENCOUNTER — Encounter: Payer: Self-pay | Admitting: Cardiovascular Disease

## 2013-11-29 ENCOUNTER — Ambulatory Visit (INDEPENDENT_AMBULATORY_CARE_PROVIDER_SITE_OTHER): Payer: Medicare Other | Admitting: Cardiovascular Disease

## 2013-11-29 VITALS — BP 110/80 | HR 77 | Ht 71.0 in | Wt 282.5 lb

## 2013-11-29 DIAGNOSIS — I868 Varicose veins of other specified sites: Secondary | ICD-10-CM

## 2013-11-29 DIAGNOSIS — E7800 Pure hypercholesterolemia, unspecified: Secondary | ICD-10-CM

## 2013-11-29 DIAGNOSIS — I8390 Asymptomatic varicose veins of unspecified lower extremity: Secondary | ICD-10-CM

## 2013-11-29 DIAGNOSIS — I481 Persistent atrial fibrillation: Secondary | ICD-10-CM

## 2013-11-29 DIAGNOSIS — I4819 Other persistent atrial fibrillation: Secondary | ICD-10-CM

## 2013-11-29 MED ORDER — METOPROLOL SUCCINATE ER 100 MG PO TB24: 100.0000 mg | ORAL_TABLET | Freq: Every day | ORAL | Status: DC

## 2013-11-29 NOTE — Assessment & Plan Note (Signed)
Will check fasting lipids tomorrow.  I will see him in 6 months for follow up of his lipids

## 2013-11-29 NOTE — Patient Instructions (Addendum)
Your physician has recommended you make the following change in your medication:  Stop Flecainide  Start Metoprolol Succinate 100 mg once daily   You have been referred to Dr. Rayann Heman for your afib   Your physician recommends that you return for lab work in:  Fasting labs tomorrow    Your physician recommends that you schedule a follow-up appointment in:  6 months

## 2013-11-29 NOTE — Assessment & Plan Note (Signed)
Louis Fuller has had vericose veins and has had vein surgery multiple times. He has normal LV systolic function and may just have significant venous insufficiency. We are not able to assess his diastolic function  I have encouraged him to elevated his legs frequently and often. He has compression hose - I've encouraged him to wear them.

## 2013-11-29 NOTE — Progress Notes (Signed)
Louis Fuller Date of Birth  [birth year] [facility] [address]    [place],   [address] [phone]  Fax  [phone]  Problem list: 1. Paroxysmal atrial fibrillation 2. Hypertension 3. Hyperlipidemia 4. Knee arthritis - needs knee replacement  History of Present Illness:  Louis Fuller is a 68 y.o. gentleman with a Hx of paroxysmal Atrial Fibrillation.  He notices it significant heart irregularities after he comes in from own grass or doing other yard work. He takes an extra half of of Toprol that seemed to help. He gets some extra exercise  March 04, 2012:  Louis Fuller is doing well.  He has some rare episodes of palpitations that clinically sound like atrial fib.  The episode lasted most of the night.   He takes propranolol on occasion for these episodes of A-Fib.  July 05, 2013:  He has moved to [place] since I last saw him.    He needs to have knee replacements.   He has vericose veins in his legs and has leg swelling.    No CP, no dyspnea.   He is in A-fib today at his office visit.   August 03, 2013:   He is still in atrial fib.  He is basically asymptomatic.  He's able to do all of his normal activities without any problems. He denies any chest pain or shortness of breath.  He has not had his myoview yet.   Echo revealed. Normal left ventricular systolic function and mild mitral regurgitation, trivial aortic insufficiency.    August 23, 2013:  Louis Fuller is seen back - he is still A. symptomatically and basically cannot tell that is in atrial fibrillation. He remains in atrial fibrillation. He's able to do all his normal activities without any issues.  He's tolerating the Flecainide well. He's on Eliquis and has not noted any bleeding.  Sept. 1, 2015:  Louis Fuller is seen  - doing well with current meds.   Ready to have a cardioversion scheduled.  Nov. 3, 2015:  Louis Fuller had a successful cardioversion about a month or so ago. Several days after that he felt that his  heart was beating correctly. He is seen today for followup and in fact is back in atrial fibrillation.  He continues to be asymptomatic.     Current Outpatient Prescriptions on File Prior to Visit  Medication Sig Dispense Refill  . meloxicam (MOBIC) 15 MG tablet Take 15 mg by mouth daily.        . metoprolol (TOPROL-XL) 50 MG 24 hr tablet Take 50 mg by mouth daily.        . Multiple Vitamin (MULTIVITAMIN) tablet Take 1 tablet by mouth daily.        [name] Kitchen omeprazole (PRILOSEC) 20 MG capsule Take 20 mg by mouth daily.        . Atorvastatin 20 mg  20 mg daily      . Tadalafil (CIALIS PO) Take by mouth. As needed         No Known Allergies  Past Medical History  Diagnosis Date  . Intermittent atrial fibrillation   . Mitral regurgitation   . Varicose veins   . ED (erectile dysfunction)   . GERD (gastroesophageal reflux disease)   . Hypercholesterolemia     Past Surgical History  Procedure Laterality Date  . Cataract extraction      left  . Vein laser surgery    . Cataract extraction    . Cardioversion N/A 10/06/2013  Procedure: CARDIOVERSION;  Surgeon: Lelon Perla, MD;  Location: [facility] ENDOSCOPY;  Service: Cardiovascular;  Laterality: N/A;    History  Smoking status  . Never Smoker   Smokeless tobacco  . Not on file    History  Alcohol Use  . 0.0 oz/week    Family History  Problem Relation Age of Onset  . Family history unknown: Yes    Reviw of Systems:  Reviewed in the HPI.  All other systems are negative.  Physical Exam: BP 110/80 mmHg  Pulse 77  Ht 5\' 11"  (1.803 m)  Wt 282 lb 8 oz (128.141 kg)  BMI 39.42 kg/m2 The patient is alert and oriented x 3.  The mood and affect are normal.   Skin: warm and dry.  Color is normal.    HEENT:   [place]/ AT, normal carotids  Lungs: clear   Heart: irreg. Irreg. ,   No murmurs.  Abdomen: + BS, no HSM, soft  Extremities:  2+ leg edema  Neuro:  Non focal    ECG: Nov. 3, 2015:  Atrial fib at rate of 77. NS ST abn.    Assessment / Plan:

## 2013-11-29 NOTE — Assessment & Plan Note (Addendum)
Louis Fuller was cardioverted a month or so ago. Unfortunately, he has converted back into atrial fibrillation. It is clear that the Flecainide  not going to be effective. This point we will discontinue flecainide. We will increase the metoprolol  XL to 100 mg a day He'll check his heart rate and blood pressure on arrival basis and will make sure that heart rate status and age range. If it's above 100 he is to call us for further medication adjustment.  We discussed the various options including rate control and anticoagulation and also a more aggressive approach with rhythm control.  We will schedule him to see Dr. Rayann Heman in A. Fib clinic. We will probably need to consider tikosyn therapy vs. A-fib ablation.   Continue Eliquis and rate control for now .

## 2013-11-30 ENCOUNTER — Other Ambulatory Visit (INDEPENDENT_AMBULATORY_CARE_PROVIDER_SITE_OTHER): Payer: Medicare Other | Admitting: *Deleted

## 2013-11-30 DIAGNOSIS — I4819 Other persistent atrial fibrillation: Secondary | ICD-10-CM

## 2013-11-30 DIAGNOSIS — E7800 Pure hypercholesterolemia, unspecified: Secondary | ICD-10-CM

## 2013-11-30 DIAGNOSIS — I481 Persistent atrial fibrillation: Secondary | ICD-10-CM

## 2013-12-01 LAB — BASIC METABOLIC PANEL WITH GFR
BUN/Creatinine Ratio: 16 (ref 10–22)
BUN: 15 mg/dL (ref 8–27)
CO2: 25 mmol/L (ref 18–29)
Calcium: 9.5 mg/dL (ref 8.6–10.2)
Chloride: 99 mmol/L (ref 97–108)
Creatinine, Ser: 0.96 mg/dL (ref 0.76–1.27)
GFR calc Af Amer: 95 mL/min/1.73
GFR calc non Af Amer: 83 mL/min/1.73
Glucose: 80 mg/dL (ref 65–99)
Potassium: 5 mmol/L (ref 3.5–5.2)
Sodium: 140 mmol/L (ref 134–144)

## 2013-12-01 LAB — LIPID PANEL
CHOLESTEROL TOTAL: 153 mg/dL (ref 100–199)
Chol/HDL Ratio: 2.7 ratio units (ref 0.0–5.0)
HDL: 57 mg/dL (ref >39)
LDL Calculated: 75 mg/dL (ref 0–99)
TRIGLYCERIDES: 106 mg/dL (ref 0–149)
VLDL Cholesterol Cal: 21 mg/dL (ref 5–40)

## 2013-12-01 LAB — HEPATIC FUNCTION PANEL
ALK PHOS: 88 IU/L (ref 39–117)
ALT: 30 IU/L (ref 0–44)
AST: 21 IU/L (ref 0–40)
Albumin: 4.1 g/dL (ref 3.6–4.8)
BILIRUBIN TOTAL: 0.8 mg/dL (ref 0.0–1.2)
Bilirubin, Direct: 0.21 mg/dL (ref 0.00–0.40)
Total Protein: 6.6 g/dL (ref 6.0–8.5)

## 2014-01-11 ENCOUNTER — Encounter: Payer: Self-pay | Admitting: Internal Medicine

## 2014-01-11 ENCOUNTER — Ambulatory Visit: Payer: Medicare Other | Admitting: Internal Medicine

## 2014-01-11 ENCOUNTER — Ambulatory Visit (INDEPENDENT_AMBULATORY_CARE_PROVIDER_SITE_OTHER): Payer: Medicare Other | Admitting: Internal Medicine

## 2014-01-11 VITALS — BP 122/78 | HR 88 | Ht 71.0 in | Wt 280.6 lb

## 2014-01-11 DIAGNOSIS — I4819 Other persistent atrial fibrillation: Secondary | ICD-10-CM

## 2014-01-11 DIAGNOSIS — I481 Persistent atrial fibrillation: Secondary | ICD-10-CM

## 2014-01-11 NOTE — Patient Instructions (Signed)
Your physician recommends that you schedule a follow-up appointment as scheduled with Dr. Acie Fredrickson and as needed with Dr Rayann Heman

## 2014-01-12 ENCOUNTER — Encounter: Payer: Self-pay | Admitting: Internal Medicine

## 2014-01-12 NOTE — Progress Notes (Signed)
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  He reports having atrial fibrillation for several years.  He has failed medical therapy with flecainide.  He thinks that he has been persisently in afib for the past year, but is minimally symptomatic and really not sure.  He feels that his exercise tolerance is preserved. He was cardioverted 9/15 but did not maintain sinus rhythm long enough to appreciate a difference.  He remains active.  He does not feel any limitations related to his afib.  He is tolerating anticoagulation without difficulty.  Today, he denies symptoms of palpitations, chest pain, shortness of breath, orthopnea, PND, lower extremity edema, dizziness, presyncope, syncope, or neurologic sequela. The patient is tolerating medications without difficulties and is otherwise without complaint today.   Past Medical History  Diagnosis Date  . Longstanding persistent atrial fibrillation   . Mitral regurgitation   . Varicose veins   . ED (erectile dysfunction)   . GERD (gastroesophageal reflux disease)   . Hypercholesterolemia    Past Surgical History  Procedure Laterality Date  . Cataract extraction      left  . Vein laser surgery    . Cataract extraction    . Cardioversion N/A 10/06/2013    Procedure: CARDIOVERSION;  Surgeon: Lelon Perla, MD;  Location: [facility] ENDOSCOPY;  Service: Cardiovascular;  Laterality: N/A;    Current Outpatient Prescriptions  Medication Sig Dispense Refill  . apixaban (ELIQUIS) 5 MG TABS tablet Take 1 tablet (5 mg total) by mouth 2 (two) times daily. 60 tablet 6  . atorvastatin (LIPITOR) 40 MG tablet Take 1 tablet (40 mg total) by mouth daily. 30 tablet 2  . Carboxymethylcellul-Glycerin (REFRESH OPTIVE OP) Place 1 drop into both eyes daily as needed (for itchy eyes).     . meloxicam (MOBIC) 15 MG  tablet Take 15 mg by mouth daily.      . metoprolol succinate (TOPROL-XL) 100 MG 24 hr tablet Take 1 tablet (100 mg total) by mouth daily. Take with or immediately following a meal. 30 tablet 6  . Multiple Vitamin (MULTIVITAMIN) tablet Take 1 tablet by mouth daily.      [name] Kitchen omeprazole (PRILOSEC) 20 MG capsule Take 20 mg by mouth daily.      . propranolol (INDERAL) 10 MG tablet Take 10 mg by mouth 4 (four) times daily as needed (for heart).     No current facility-administered medications for this visit.    No Known Allergies  History   Social History  . Marital Status: Married    Spouse Name: N/A    Number of Children: N/A  . Years of Education: N/A   Occupational History  . Not on file.   Social History Main Topics  . Smoking status: Never Smoker   . Smokeless tobacco: Not on file  . Alcohol Use: 0.0 oz/week  . Drug Use: No  . Sexual Activity: Not on file   Other Topics Concern  . Not on file   Social History Narrative    FH-  Denies FH of arrhythmia or early onset AF  ROS- All systems are reviewed and negative except as per the HPI above  Physical Exam: Filed Vitals:   01/11/14 1541  BP: 122/78  Pulse: 88  Height: 5\' 11"  (1.803 m)  Weight: 280 lb 9.6 oz (127.279 kg)    GEN- The patient  is well appearing, alert and oriented x 3 today.   Head- normocephalic, atraumatic Eyes-  Sclera clear, conjunctiva pink Ears- hearing intact Oropharynx- clear Neck- supple, no JVP Lymph- no cervical lymphadenopathy Lungs- Clear to ausculation bilaterally, normal work of breathing Heart- irregular rate and rhythm  GI- soft, NT, ND, + BS Extremities- no clubbing, cyanosis, or edema MS- no significant deformity or atrophy Skin- no rash or lesion Psych- euthymic mood, full affect Neuro- strength and sensation are intact  EKG today reveals afib, V rate 88 bpm, LVH Echo 07/21/13- EF 60%, mild MR, LA 42mm Epic notes including Dr Lanny Hurst notes are reviewed  Assessment and  Plan:  1. Longstanding persistent atrial fibrillation The patient has minimally symptomatic AF.  Given is prolonged nature, I suspect that our ability to acheive/ maintain sinus rhythm long term is reduced.  I discussed tikosyn as an option to try to achieve sinus rhythm and then better assess for symptomatic relief.  He does not meet criteria (IIb indication) for ablation given absence of symptoms.  At this time, he declines tikosyn.  He would prefer a rate control strategy long term.  He is presently adequately rate controlled and no changes are therefore made today. His chads2vasc score is 1 (age).  Given averroes data and his present tolerance of eliquis, I would favor continuing this medicine long term.  He will continue to follow with Dr Acie Fredrickson.  I would recommend repeat echo every year or so to make sure that he does not experience a reduction in EF related to AF. I will see as needed going forward.  He is aware that he can contact my office if I can be of assistance in the future.

## 2014-01-14 ENCOUNTER — Other Ambulatory Visit: Payer: Self-pay | Admitting: Cardiovascular Disease

## 2014-01-30 ENCOUNTER — Ambulatory Visit: Payer: Medicare Other | Admitting: Internal Medicine

## 2014-02-09 ENCOUNTER — Telehealth: Payer: Self-pay | Admitting: Cardiovascular Disease

## 2014-02-09 DIAGNOSIS — I480 Paroxysmal atrial fibrillation: Secondary | ICD-10-CM

## 2014-02-09 DIAGNOSIS — R0602 Shortness of breath: Secondary | ICD-10-CM

## 2014-02-09 MED ORDER — APIXABAN 5 MG PO TABS: 5.0000 mg | ORAL_TABLET | Freq: Two times a day (BID) | ORAL | Status: DC

## 2014-02-09 NOTE — Telephone Encounter (Signed)
Notified patient Eliquis 5 mg take one tablet twice a day [facility] tablets with 3 refills.

## 2014-02-09 NOTE — Telephone Encounter (Signed)
°  Pt calling stating that he needs [place] to send a [facility] Eliquis 5 [facility].  If we could fax over a request to  [phone] Needs a 30 day supply and would like a call back when done.

## 2014-04-27 ENCOUNTER — Other Ambulatory Visit: Payer: Self-pay | Admitting: Cardiovascular Disease

## 2014-05-25 ENCOUNTER — Ambulatory Visit
Admit: 2014-05-25 | Disposition: A | Discharge status: Home / Self Care | Payer: Self-pay | Attending: Orthopedic Surgery | Admitting: Orthopedic Surgery

## 2014-06-15 ENCOUNTER — Ambulatory Visit (INDEPENDENT_AMBULATORY_CARE_PROVIDER_SITE_OTHER): Payer: Medicare Other | Admitting: Cardiovascular Disease

## 2014-06-15 ENCOUNTER — Encounter: Payer: Self-pay | Admitting: Cardiovascular Disease

## 2014-06-15 VITALS — BP 110/80 | HR 98 | Ht 71.0 in | Wt 272.5 lb

## 2014-06-15 DIAGNOSIS — I4891 Unspecified atrial fibrillation: Secondary | ICD-10-CM

## 2014-06-15 DIAGNOSIS — I481 Persistent atrial fibrillation: Secondary | ICD-10-CM

## 2014-06-15 DIAGNOSIS — I4819 Other persistent atrial fibrillation: Secondary | ICD-10-CM

## 2014-06-15 MED ORDER — DIGOXIN 250 MCG PO TABS: 0.2500 mg | ORAL_TABLET | Freq: Every day | ORAL | Status: DC

## 2014-06-15 NOTE — Progress Notes (Signed)
Cardiology Office Note   Date:  06/15/2014   ID:  Louis Fuller, DOB 10-26-1945, MRN [id number]  PCP:  Marcelina Morel  Cardiologist:   Acie Fredrickson Wonda Cheng, MD   Chief Complaint  Patient presents with  . Atrial Fibrillation    6 month follow up. Meds reviewed by the patient verbally. Pt. c/o some A-fib spells.    1. Paroxysmal atrial fibrillation 2. Hypertension 3. Hyperlipidemia 4. Knee arthritis - needs knee replacement  History of Present Illness:  Louis Fuller is a 69 y.o. gentleman with a Hx of paroxysmal Atrial Fibrillation. He notices it significant heart irregularities after he comes in from own grass or doing other yard work. He takes an extra half of of Toprol that seemed to help. He gets some extra exercise  March 04, 2012:  Louis Fuller is doing well. He has some rare episodes of palpitations that clinically sound like atrial fib. The episode lasted most of the night. He takes propranolol on occasion for these episodes of A-Fib.  July 05, 2013:  He has moved to [place] since I last saw him. He needs to have knee replacements.  He has vericose veins in his legs and has leg swelling. No CP, no dyspnea.  He is in A-fib today at his office visit.   August 03, 2013:  He is still in atrial fib. He is basically asymptomatic. He's able to do all of his normal activities without any problems. He denies any chest pain or shortness of breath. He has not had his myoview yet.  Echo revealed. Normal left ventricular systolic function and mild mitral regurgitation, trivial aortic insufficiency.   August 23, 2013: Louis Fuller is seen back - he is still A. symptomatically and basically cannot tell that is in atrial fibrillation. He remains in atrial fibrillation. He's able to do all his normal activities without any issues. He's tolerating the Flecainide well. He's on Eliquis and has not noted any bleeding.  Sept. 1, 2015: Louis Fuller is seen - doing well with current meds.    Ready to have a cardioversion scheduled.  Nov. 3, 2015: Louis Fuller had a successful cardioversion about a month or so ago. Several days after that he felt that his heart was beating correctly. He is seen today for followup and in fact is back in atrial fibrillation. He continues to be asymptomatic.    Jun 15, 2014: Louis Fuller is a 69 y.o. male who presents for follow-up of his atrial fibrillation. He has been seen by Dr. Rayann Heman for consideration for A. fib ablation. Given his lack of symptoms, A. fib ablation is not currently indicated. He declined one to start Tikosyn. His echocardiogram in June, 2015 showed normal left ventricle systolic function.     Past Medical History  Diagnosis Date  . Longstanding persistent atrial fibrillation   . Mitral regurgitation   . Varicose veins   . ED (erectile dysfunction)   . GERD (gastroesophageal reflux disease)   . Hypercholesterolemia     Past Surgical History  Procedure Laterality Date  . Cataract extraction      left  . Vein laser surgery    . Cataract extraction    . Cardioversion N/A 10/06/2013    Procedure: CARDIOVERSION;  Surgeon: Lelon Perla, MD;  Location: [facility] ENDOSCOPY;  Service: Cardiovascular;  Laterality: N/A;     Current Outpatient Prescriptions  Medication Sig Dispense Refill  . apixaban (ELIQUIS) 5 MG TABS tablet Take 1 tablet (5 mg total) by mouth 2 (two)  times daily. 180 tablet 3  . atorvastatin (LIPITOR) 40 MG tablet TAKE 1 TABLET DAILY 90 tablet 0  . Carboxymethylcellul-Glycerin (REFRESH OPTIVE OP) Place 1 drop into both eyes daily as needed (for itchy eyes).     . metoprolol succinate (TOPROL-XL) 100 MG 24 hr tablet Take 1 tablet (100 mg total) by mouth daily. Take with or immediately following a meal. 30 tablet 6  . Multiple Vitamin (MULTIVITAMIN) tablet Take 1 tablet by mouth daily.      [name] Kitchen omeprazole (PRILOSEC) 20 MG capsule Take 20 mg by mouth daily.      . propranolol (INDERAL) 10 MG tablet Take 10 mg by  mouth 4 (four) times daily as needed (for heart).     No current facility-administered medications for this visit.    Allergies:   Review of patient's allergies indicates no known allergies.    Social History:  The patient  reports that he has never smoked. He does not have any smokeless tobacco history on file. He reports that he drinks alcohol. He reports that he does not use illicit drugs.   Family History:  The patient's Family history is unknown by patient.    ROS:  Please see the history of present illness.    Review of Systems: Constitutional:  denies fever, chills, diaphoresis, appetite change and fatigue.  HEENT: denies photophobia, eye pain, redness, hearing loss, ear pain, congestion, sore throat, rhinorrhea, sneezing, neck pain, neck stiffness and tinnitus.  Respiratory: denies SOB, DOE, cough, chest tightness, and wheezing.  Cardiovascular: denies chest pain, palpitations and leg swelling.  Gastrointestinal: denies nausea, vomiting, abdominal pain, diarrhea, constipation, blood in stool.  Genitourinary: denies dysuria, urgency, frequency, hematuria, flank pain and difficulty urinating.  Musculoskeletal: denies  myalgias, back pain, joint swelling, arthralgias and gait problem.   Skin: denies pallor, rash and wound.  Neurological: denies dizziness, seizures, syncope, weakness, light-headedness, numbness and headaches.   Hematological: denies adenopathy, easy bruising, personal or family bleeding history.  Psychiatric/ Behavioral: denies suicidal ideation, mood changes, confusion, nervousness, sleep disturbance and agitation.       All other systems are reviewed and negative.    PHYSICAL EXAM: VS:  BP 110/80 mmHg  Pulse 98  Ht 5\' 11"  (1.803 m)  Wt 123.605 kg (272 lb 8 oz)  BMI 38.02 kg/m2 , BMI Body mass index is 38.02 kg/(m^2). GEN: Well nourished, well developed, in no acute distress HEENT: normal Neck: no JVD, carotid bruits, or masses Cardiac: RRR; no  murmurs, rubs, or gallops,no edema  Respiratory:  clear to auscultation bilaterally, normal work of breathing GI: soft, nontender, nondistended, + BS MS: no deformity or atrophy Skin: warm and dry, no rash Neuro:  Strength and sensation are intact Psych: normal   EKG:  EKG is ordered today. The ekg ordered today demonstrates atrial fibrillation with a rate of 98.   Recent Labs: 09/27/2013: Hemoglobin 14.9; Platelets 192 11/30/2013: ALT 30; BUN 15; Creatinine 0.96; Potassium 5.0; Sodium 140    Lipid Panel    Component Value Date/Time   CHOL 153 11/30/2013 0802   CHOL 142 12/11/2010 1035   TRIG 106 11/30/2013 0802   HDL 57 11/30/2013 0802   HDL 55.80 12/11/2010 1035   CHOLHDL 2.7 11/30/2013 0802   CHOLHDL 3 12/11/2010 1035   VLDL 13.8 12/11/2010 1035   LDLCALC 75 11/30/2013 0802   LDLCALC 72 12/11/2010 1035      Wt Readings from Last 3 Encounters:  06/15/14 123.605 kg (272 lb 8 oz)  01/11/14  127.279 kg (280 lb 9.6 oz)  11/29/13 128.141 kg (282 lb 8 oz)      Other studies Reviewed: Additional studies/ records that were reviewed today include: . Review of the above records demonstrates:    ASSESSMENT AND PLAN:  1. Persistent atrial fibrillation - his HR  remains a little high. His blood pressure is low normal range. We will try adding digoxin 0.25 mg a day. I'll see him in 3 months for follow-up visit and EKG. We will plan on getting any echocardiogram just prior to that office visit. 2. Hypertension - blood pressure is in the low-normal range. 3. Hyperlipidemia 4. Knee arthritis - needs knee replacement   Current medicines are reviewed at length with the patient today.  The patient does not have concerns regarding medicines.  The following changes have been made:  no change  Labs/ tests ordered today include:  Orders Placed This Encounter  Procedures  . EKG 12-Lead     Disposition:   FU with me in 6 months       Malita Ignasiak, Wonda Cheng, MD  06/15/2014 11:05 AM     [facility] [place], [place], [place]  [address] Phone: [phone]; Fax: [phone]   [facility]  [address] [place], [place]  [address] [phone]    Fax [phone]

## 2014-06-15 NOTE — Patient Instructions (Signed)
Medication Instructions:  Please start Digoxin 0.25 mg once daily  Labwork: None  Testing/Procedures: Your physician has requested that you have an echocardiogram. Echocardiography is a painless test that uses sound waves to create images of your heart. It provides your doctor with information about the size and shape of your heart and how well your heart's chambers and valves are working. This procedure takes approximately one hour. There are no restrictions for this procedure.  Follow-Up: Your physician recommends that you schedule a follow-up appointment in: 3 months   Any Other Special Instructions Will Be Listed Below (If Applicable).    Echocardiogram An echocardiogram, or echocardiography, uses sound waves (ultrasound) to produce an image of your heart. The echocardiogram is simple, painless, obtained within a short period of time, and offers valuable information to your health care provider. The images from an echocardiogram can provide information such as:  Evidence of coronary artery disease (CAD).  Heart size.  Heart muscle function.  Heart valve function.  Aneurysm detection.  Evidence of a past heart attack.  Fluid buildup around the heart.  Heart muscle thickening.  Assess heart valve function. LET [facility] CARE PROVIDER KNOW ABOUT:  Any allergies you have.  All medicines you are taking, including vitamins, herbs, eye drops, creams, and over-the-counter medicines.  Previous problems you or members of your family have had with the use of anesthetics.  Any blood disorders you have.  Previous surgeries you have had.  Medical conditions you have.  Possibility of pregnancy, if this applies. BEFORE THE PROCEDURE  No special preparation is needed. Eat and drink normally.  PROCEDURE   In order to produce an image of your heart, gel will be applied to your chest and a wand-like tool (transducer) will be moved over your chest. The gel will help transmit  the sound waves from the transducer. The sound waves will harmlessly bounce off your heart to allow the heart images to be captured in real-time motion. These images will then be recorded.  You may need an IV to receive a medicine that improves the quality of the pictures. AFTER THE PROCEDURE You may return to your normal schedule including diet, activities, and medicines, unless your health care provider tells you otherwise. Document Released: 01/11/2000 Document Revised: 05/30/2013 Document Reviewed: 09/20/2012 [facility] Patient Information 2015 [place], [place]. This information is not intended to replace advice given to you by your health care provider. Make sure you discuss any questions you have with your health care provider.

## 2014-06-22 ENCOUNTER — Other Ambulatory Visit: Payer: Self-pay

## 2014-06-22 ENCOUNTER — Ambulatory Visit (INDEPENDENT_AMBULATORY_CARE_PROVIDER_SITE_OTHER): Payer: Medicare Other

## 2014-06-22 DIAGNOSIS — I4891 Unspecified atrial fibrillation: Secondary | ICD-10-CM | POA: Diagnosis not present

## 2014-07-19 ENCOUNTER — Other Ambulatory Visit: Payer: Self-pay | Admitting: Cardiovascular Disease

## 2014-07-23 ENCOUNTER — Other Ambulatory Visit: Payer: Self-pay | Admitting: Cardiovascular Disease

## 2014-09-14 ENCOUNTER — Ambulatory Visit (INDEPENDENT_AMBULATORY_CARE_PROVIDER_SITE_OTHER): Payer: Medicare Other | Admitting: Cardiovascular Disease

## 2014-09-14 ENCOUNTER — Encounter: Payer: Self-pay | Admitting: Cardiovascular Disease

## 2014-09-14 VITALS — BP 120/70 | HR 89 | Ht 70.0 in | Wt 277.0 lb

## 2014-09-14 DIAGNOSIS — R0602 Shortness of breath: Secondary | ICD-10-CM | POA: Diagnosis not present

## 2014-09-14 DIAGNOSIS — E785 Hyperlipidemia, unspecified: Secondary | ICD-10-CM | POA: Diagnosis not present

## 2014-09-14 DIAGNOSIS — I4819 Other persistent atrial fibrillation: Secondary | ICD-10-CM

## 2014-09-14 DIAGNOSIS — I481 Persistent atrial fibrillation: Secondary | ICD-10-CM

## 2014-09-14 NOTE — Progress Notes (Signed)
Cardiology Office Note   Date:  09/14/2014   ID:  Louis Fuller, DOB 18-Jul-1945, MRN [id number]  PCP:  Marcelina Morel  Cardiologist:   Acie Fredrickson Wonda Cheng, MD   Chief Complaint  Patient presents with  . other    3 month follow up. Meds reviewed by the patient verbally. Pt. c/o shortness of breath.    1. Paroxysmal atrial fibrillation 2. Hypertension 3. Hyperlipidemia 4. Knee arthritis - needs knee replacement  History of Present Illness:  Louis Fuller is a 69 y.o. gentleman with a Hx of paroxysmal Atrial Fibrillation. He notices it significant heart irregularities after he comes in from own grass or doing other yard work. He takes an extra half of of Toprol that seemed to help. He gets some extra exercise  March 04, 2012:  Louis Fuller is doing well. He has some rare episodes of palpitations that clinically sound like atrial fib. The episode lasted most of the night. He takes propranolol on occasion for these episodes of A-Fib.  July 05, 2013:  He has moved to [place] since I last saw him. He needs to have knee replacements.  He has vericose veins in his legs and has leg swelling. No CP, no dyspnea.  He is in A-fib today at his office visit.   August 03, 2013:  He is still in atrial fib. He is basically asymptomatic. He's able to do all of his normal activities without any problems. He denies any chest pain or shortness of breath. He has not had his myoview yet.  Echo revealed. Normal left ventricular systolic function and mild mitral regurgitation, trivial aortic insufficiency.   August 23, 2013: Louis Fuller is seen back - he is still A. symptomatically and basically cannot tell that is in atrial fibrillation. He remains in atrial fibrillation. He's able to do all his normal activities without any issues. He's tolerating the Flecainide well. He's on Eliquis and has not noted any bleeding.  Sept. 1, 2015: Louis Fuller is seen - doing well with current meds.  Ready to have a  cardioversion scheduled.  Nov. 3, 2015: Louis Fuller had a successful cardioversion about a month or so ago. Several days after that he felt that his heart was beating correctly. He is seen today for followup and in fact is back in atrial fibrillation. He continues to be asymptomatic.    Jun 15, 2014: Louis Fuller is a 69 y.o. male who presents for follow-up of his atrial fibrillation. He has been seen by Dr. Rayann Heman for consideration for A. fib ablation. Given his lack of symptoms, A. fib ablation is not currently indicated. He declined one to start Tikosyn. His echocardiogram in June, 2015 showed normal left ventricle systolic function.  September 14, 2014:  Doing well.  No symptoms  Limited by knee pain .     Past Medical History  Diagnosis Date  . Longstanding persistent atrial fibrillation   . Mitral regurgitation   . Varicose veins   . ED (erectile dysfunction)   . GERD (gastroesophageal reflux disease)   . Hypercholesterolemia     Past Surgical History  Procedure Laterality Date  . Cataract extraction      left  . Vein laser surgery    . Cataract extraction    . Cardioversion N/A 10/06/2013    Procedure: CARDIOVERSION;  Surgeon: Lelon Perla, MD;  Location: [facility] ENDOSCOPY;  Service: Cardiovascular;  Laterality: N/A;     Current Outpatient Prescriptions  Medication Sig Dispense Refill  . apixaban (ELIQUIS)  5 MG TABS tablet Take 1 tablet (5 mg total) by mouth 2 (two) times daily. 180 tablet 3  . atorvastatin (LIPITOR) 40 MG tablet TAKE 1 TABLET DAILY 90 tablet 3  . Carboxymethylcellul-Glycerin (REFRESH OPTIVE OP) Place 1 drop into both eyes daily as needed (for itchy eyes).     [name] Kitchen digoxin (LANOXIN) 0.25 MG tablet Take 1 tablet (0.25 mg total) by mouth daily. 90 tablet 3  . metoprolol succinate (TOPROL-XL) 100 MG 24 hr tablet TAKE 1 TABLET DAILY (TAKE WITH OR IMMEDIATELY FOLLOWING A MEAL) 30 tablet 3  . Multiple Vitamin (MULTIVITAMIN) tablet Take 1 tablet by mouth daily.       [name] Kitchen omeprazole (PRILOSEC) 20 MG capsule Take 20 mg by mouth daily.      . propranolol (INDERAL) 10 MG tablet Take 10 mg by mouth 4 (four) times daily as needed (for heart).     No current facility-administered medications for this visit.    Allergies:   Review of patient's allergies indicates no known allergies.    Social History:  The patient  reports that he has never smoked. He does not have any smokeless tobacco history on file. He reports that he drinks alcohol. He reports that he does not use illicit drugs.   Family History:  The patient's Family history is unknown by patient.    ROS:  Please see the history of present illness.    Review of Systems: Constitutional:  denies fever, chills, diaphoresis, appetite change and fatigue.  HEENT: denies photophobia, eye pain, redness, hearing loss, ear pain, congestion, sore throat, rhinorrhea, sneezing, neck pain, neck stiffness and tinnitus.  Respiratory: denies SOB, DOE, cough, chest tightness, and wheezing.  Cardiovascular: denies chest pain, palpitations and leg swelling.  Gastrointestinal: denies nausea, vomiting, abdominal pain, diarrhea, constipation, blood in stool.  Genitourinary: denies dysuria, urgency, frequency, hematuria, flank pain and difficulty urinating.  Musculoskeletal: denies  myalgias, back pain, joint swelling, arthralgias and gait problem.   Skin: denies pallor, rash and wound.  Neurological: denies dizziness, seizures, syncope, weakness, light-headedness, numbness and headaches.   Hematological: denies adenopathy, easy bruising, personal or family bleeding history.  Psychiatric/ Behavioral: denies suicidal ideation, mood changes, confusion, nervousness, sleep disturbance and agitation.       All other systems are reviewed and negative.    PHYSICAL EXAM: VS:  BP 120/70 mmHg  Pulse 89  Ht 5\' 10"  (1.778 m)  Wt 125.646 kg (277 lb)  BMI 39.75 kg/m2 , BMI Body mass index is 39.75 kg/(m^2). GEN: Well  nourished, well developed, in no acute distress HEENT: normal Neck: no JVD, carotid bruits, or masses Cardiac: Irreg. Irreg ; no murmurs, rubs, or gallops, trace  edema  Respiratory:  clear to auscultation bilaterally, normal work of breathing GI: soft, nontender, nondistended, + BS MS: no deformity or atrophy Skin: warm and dry, no rash Neuro:  Strength and sensation are intact Psych: normal   EKG:  EKG is ordered today. The ekg ordered today demonstrates atrial fibrillation with a rate of 98.   Recent Labs: 09/27/2013: Hemoglobin 14.9; Platelets 192 11/30/2013: ALT 30; BUN 15; Creatinine, Ser 0.96; Potassium 5.0; Sodium 140    Lipid Panel    Component Value Date/Time   CHOL 153 11/30/2013 0802   CHOL 142 12/11/2010 1035   TRIG 106 11/30/2013 0802   HDL 57 11/30/2013 0802   HDL 55.80 12/11/2010 1035   CHOLHDL 2.7 11/30/2013 0802   CHOLHDL 3 12/11/2010 1035   VLDL 13.8 12/11/2010 1035  LDLCALC 75 11/30/2013 0802   LDLCALC 72 12/11/2010 1035       Wt Readings from Last 3 Encounters:  09/14/14 125.646 kg (277 lb)  06/15/14 123.605 kg (272 lb 8 oz)  01/11/14 127.279 kg (280 lb 9.6 oz)      Other studies Reviewed: Additional studies/ records that were reviewed today include: . Review of the above records demonstrates:    ASSESSMENT AND PLAN:  1. Persistent atrial fibrillation -  Louis Fuller is doing well.  Has chronic atrial fib.  He is completely asymptomatic. Continue current meds.   2. Hypertension - blood pressure is in the low-normal range.  3. Hyperlipidemia - will check fasting lipids at his next office visit .   4. Knee arthritis - needs knee replacement   Current medicines are reviewed at length with the patient today.  The patient does not have concerns regarding medicines.  The following changes have been made:  no change  Labs/ tests ordered today include:   Orders Placed This Encounter  Procedures  . EKG 12-Lead     Disposition:   FU with me in  6 months       Ritisha Deitrick, Wonda Cheng, MD  09/14/2014 11:54 AM    [facility] [place], [place], [place]  [address] Phone: [phone]; Fax: [phone]   [facility]  [address] [place], [place]  [address] [phone]    Fax [phone]

## 2014-09-14 NOTE — Patient Instructions (Signed)
Medication Instructions:  Your physician recommends that you continue on your current medications as directed. Please refer to the Current Medication list given to you today.   Labwork: Your physician recommends that you return for a FASTING lipid, liver, BMET in six months. Nothing to eat or drink after midnight the evening before your labs   Testing/Procedures: none  Follow-Up: Your physician wants you to follow-up in: six months with Dr. Vilinda Boehringer will receive a reminder letter in the mail two months in advance. If you don't receive a letter, please call our office to schedule the follow-up appointment.   Any Other Special Instructions Will Be Listed Below (If Applicable).

## 2014-11-27 ENCOUNTER — Other Ambulatory Visit: Payer: Self-pay | Admitting: Cardiovascular Disease

## 2014-12-20 ENCOUNTER — Telehealth: Payer: Self-pay

## 2014-12-20 ENCOUNTER — Telehealth: Payer: Self-pay | Admitting: *Deleted

## 2014-12-20 ENCOUNTER — Telehealth: Payer: Self-pay | Admitting: Cardiovascular Disease

## 2014-12-20 NOTE — Telephone Encounter (Signed)
Cardiac clearance complete. Order was refaxed.

## 2014-12-20 NOTE — Telephone Encounter (Signed)
Faxed clearance for colonoscopy to [facility] at [phone]

## 2014-12-20 NOTE — Telephone Encounter (Signed)
Request for surgical clearance:  1. What type of surgery is being performed? colonoscopy  2. When is this surgery scheduled? Tues 12/26/14  3. Are there any medications that need to be held prior to surgery and how long? Elioquis  4. Name of physician performing surgery? [facility]  Dr. Gustavo Lah   5. What is your office phone and fax number? Clearance was sent in last week needs it stat!!!!  Telephone number [phone] attn: Tabitha 6.

## 2014-12-20 NOTE — Telephone Encounter (Signed)
New Message  Pt following up on surgical clearance that was sent by [facility]. Please call back and discuss.

## 2014-12-25 ENCOUNTER — Encounter: Payer: Self-pay | Admitting: *Deleted

## 2014-12-26 ENCOUNTER — Encounter
Admission: RE | Disposition: A | Discharge status: Home / Self Care | Payer: Self-pay | Source: Ambulatory Visit | Attending: Gastroenterology

## 2014-12-26 ENCOUNTER — Encounter: Payer: Self-pay | Admitting: *Deleted

## 2014-12-26 ENCOUNTER — Ambulatory Visit: Payer: Medicare Other | Admitting: Certified Registered Nurse Anesthetist

## 2014-12-26 ENCOUNTER — Ambulatory Visit
Admission: RE | Admit: 2014-12-26 | Discharge: 2014-12-26 | Disposition: A | Discharge status: Home / Self Care | Payer: Medicare Other | Source: Ambulatory Visit | Attending: Gastroenterology | Admitting: Gastroenterology

## 2014-12-26 DIAGNOSIS — K219 Gastro-esophageal reflux disease without esophagitis: Secondary | ICD-10-CM | POA: Diagnosis present

## 2014-12-26 DIAGNOSIS — Z9849 Cataract extraction status, unspecified eye: Secondary | ICD-10-CM | POA: Diagnosis not present

## 2014-12-26 DIAGNOSIS — E7800 Pure hypercholesterolemia, unspecified: Secondary | ICD-10-CM | POA: Diagnosis not present

## 2014-12-26 DIAGNOSIS — D124 Benign neoplasm of descending colon: Secondary | ICD-10-CM | POA: Diagnosis not present

## 2014-12-26 DIAGNOSIS — K2970 Gastritis, unspecified, without bleeding: Secondary | ICD-10-CM | POA: Insufficient documentation

## 2014-12-26 DIAGNOSIS — Z86010 Personal history of colonic polyps: Secondary | ICD-10-CM | POA: Insufficient documentation

## 2014-12-26 DIAGNOSIS — I872 Venous insufficiency (chronic) (peripheral): Secondary | ICD-10-CM | POA: Insufficient documentation

## 2014-12-26 DIAGNOSIS — N529 Male erectile dysfunction, unspecified: Secondary | ICD-10-CM | POA: Insufficient documentation

## 2014-12-26 DIAGNOSIS — E785 Hyperlipidemia, unspecified: Secondary | ICD-10-CM | POA: Diagnosis not present

## 2014-12-26 DIAGNOSIS — E669 Obesity, unspecified: Secondary | ICD-10-CM | POA: Diagnosis not present

## 2014-12-26 DIAGNOSIS — Z7901 Long term (current) use of anticoagulants: Secondary | ICD-10-CM | POA: Insufficient documentation

## 2014-12-26 DIAGNOSIS — Z9889 Other specified postprocedural states: Secondary | ICD-10-CM | POA: Insufficient documentation

## 2014-12-26 DIAGNOSIS — I739 Peripheral vascular disease, unspecified: Secondary | ICD-10-CM | POA: Diagnosis not present

## 2014-12-26 DIAGNOSIS — K640 First degree hemorrhoids: Secondary | ICD-10-CM | POA: Diagnosis not present

## 2014-12-26 DIAGNOSIS — I482 Chronic atrial fibrillation: Secondary | ICD-10-CM | POA: Insufficient documentation

## 2014-12-26 DIAGNOSIS — Z6837 Body mass index (BMI) 37.0-37.9, adult: Secondary | ICD-10-CM | POA: Insufficient documentation

## 2014-12-26 DIAGNOSIS — K635 Polyp of colon: Secondary | ICD-10-CM | POA: Insufficient documentation

## 2014-12-26 DIAGNOSIS — Z79899 Other long term (current) drug therapy: Secondary | ICD-10-CM | POA: Insufficient documentation

## 2014-12-26 DIAGNOSIS — D123 Benign neoplasm of transverse colon: Secondary | ICD-10-CM | POA: Insufficient documentation

## 2014-12-26 DIAGNOSIS — K5730 Diverticulosis of large intestine without perforation or abscess without bleeding: Secondary | ICD-10-CM | POA: Insufficient documentation

## 2014-12-26 DIAGNOSIS — K319 Disease of stomach and duodenum, unspecified: Secondary | ICD-10-CM | POA: Insufficient documentation

## 2014-12-26 DIAGNOSIS — M1991 Primary osteoarthritis, unspecified site: Secondary | ICD-10-CM | POA: Insufficient documentation

## 2014-12-26 HISTORY — DX: Testicular hypofunction: E29.1

## 2014-12-26 HISTORY — DX: Peripheral vascular disease, unspecified: I73.9

## 2014-12-26 HISTORY — PX: COLONOSCOPY WITH PROPOFOL: SHX5780

## 2014-12-26 HISTORY — DX: Obesity, unspecified: E66.9

## 2014-12-26 HISTORY — DX: Alcohol abuse, uncomplicated: F10.10

## 2014-12-26 HISTORY — PX: ESOPHAGOGASTRODUODENOSCOPY (EGD) WITH PROPOFOL: SHX5813

## 2014-12-26 HISTORY — DX: Unspecified osteoarthritis, unspecified site: M19.90

## 2014-12-26 SURGERY — COLONOSCOPY WITH PROPOFOL
Anesthesia: General

## 2014-12-26 MED ORDER — PROPOFOL 10 MG/ML IV BOLUS
INTRAVENOUS | Status: DC | PRN
  Administered 2014-12-26: 35 mg via INTRAVENOUS
  Administered 2014-12-26: 100 mg via INTRAVENOUS

## 2014-12-26 MED ORDER — SODIUM CHLORIDE 0.9 % IV SOLN: INTRAVENOUS | Status: DC

## 2014-12-26 MED ORDER — PHENYLEPHRINE HCL 10 MG/ML IJ SOLN
INTRAMUSCULAR | Status: DC | PRN
  Administered 2014-12-26 (×3): 100 ug via INTRAVENOUS

## 2014-12-26 MED ORDER — SPOT INK MARKER SYRINGE KIT
PACK | SUBMUCOSAL | Status: DC | PRN
  Administered 2014-12-26: 2 mL via SUBMUCOSAL

## 2014-12-26 MED ORDER — FENTANYL CITRATE (PF) 100 MCG/2ML IJ SOLN
INTRAMUSCULAR | Status: DC | PRN
  Administered 2014-12-26: 50 ug via INTRAVENOUS

## 2014-12-26 MED ORDER — PROPOFOL 500 MG/50ML IV EMUL
INTRAVENOUS | Status: DC | PRN
  Administered 2014-12-26: 85 ug/kg/min via INTRAVENOUS

## 2014-12-26 MED ORDER — SODIUM CHLORIDE 0.9 % IV SOLN
INTRAVENOUS | Status: DC
Start: 2014-12-26 — End: 2014-12-26
  Administered 2014-12-26: 1000 mL via INTRAVENOUS

## 2014-12-26 MED ORDER — GLYCOPYRROLATE 0.2 MG/ML IJ SOLN
INTRAMUSCULAR | Status: DC | PRN
  Administered 2014-12-26: 0.2 mg via INTRAVENOUS

## 2014-12-26 MED ORDER — LIDOCAINE HCL (CARDIAC) 20 MG/ML IV SOLN
INTRAVENOUS | Status: DC | PRN
  Administered 2014-12-26: 100 mg via INTRAVENOUS

## 2014-12-26 NOTE — Anesthesia Postprocedure Evaluation (Signed)
Anesthesia Post Note  Patient: Louis Fuller  Procedure(s) Performed: Procedure(s) (LRB): COLONOSCOPY WITH PROPOFOL (N/A) ESOPHAGOGASTRODUODENOSCOPY (EGD) WITH PROPOFOL (N/A)  Patient location during evaluation: PACU Anesthesia Type: General Level of consciousness: awake Pain management: pain level controlled Vital Signs Assessment: post-procedure vital signs reviewed and stable Respiratory status: spontaneous breathing Cardiovascular status: stable Anesthetic complications: no    Last Vitals:  Filed Vitals:   12/26/14 1529 12/26/14 1530  BP: 118/76 118/76  Pulse: 93 96  Temp: 36 C 36 C  Resp: 14 19    Last Pain:  Filed Vitals:   12/26/14 1532  PainSc: 0-No pain                 VAN STAVEREN,Ashraf Mesta

## 2014-12-26 NOTE — Anesthesia Preprocedure Evaluation (Addendum)
Anesthesia Evaluation  Patient identified by MRN, date of birth, ID band Patient awake    Reviewed: Allergy & Precautions, NPO status , Patient's Chart, lab work & pertinent test results, reviewed documented beta blocker date and time   Airway Mallampati: II       Dental  (+) Teeth Intact   Pulmonary neg pulmonary ROS,    Pulmonary exam normal        Cardiovascular hypertension, Pt. on home beta blockers + Peripheral Vascular Disease   Rhythm:Irregular     Neuro/Psych negative neurological ROS  negative psych ROS   GI/Hepatic Neg liver ROS, GERD  ,  Endo/Other  negative endocrine ROS  Renal/GU negative Renal ROS     Musculoskeletal   Abdominal Normal abdominal exam  (+)   Peds  Hematology negative hematology ROS (+)   Anesthesia Other Findings   Reproductive/Obstetrics                            Anesthesia Physical Anesthesia Plan  ASA: II  Anesthesia Plan: General   Post-op Pain Management:    Induction: Intravenous  Airway Management Planned: Nasal Cannula  Additional Equipment:   Intra-op Plan:   Post-operative Plan:   Informed Consent: I have reviewed the patients History and Physical, chart, labs and discussed the procedure including the risks, benefits and alternatives for the proposed anesthesia with the patient or authorized representative who has indicated his/her understanding and acceptance.     Plan Discussed with: CRNA  Anesthesia Plan Comments:         Anesthesia Quick Evaluation

## 2014-12-26 NOTE — Op Note (Signed)
[facility] Gastroenterology Patient Name: Louis Fuller Procedure Date: 12/26/2014 2:29 PM MRN: [id number] Account #: [id number] Date of Birth: 1945-06-23 Admit Type: Outpatient Age: 69 Room: [facility] ENDO ROOM 3 Gender: Male Note Status: Finalized Procedure:         Colonoscopy Indications:       Personal history of colonic polyps Providers:         Lollie Sails, MD Referring MD:      No Local Md, MD (Referring MD) Medicines:         Monitored Anesthesia Care Complications:     No immediate complications. Procedure:         Pre-Anesthesia Assessment:                    - ASA Grade Assessment: III - A patient with severe                     systemic disease.                    After obtaining informed consent, the colonoscope was                     passed under direct vision. Throughout the procedure, the                     patient's blood pressure, pulse, and oxygen saturations                     were monitored continuously. The Colonoscope was                     introduced through the anus and advanced to the the cecum,                     identified by appendiceal orifice and ileocecal valve. The                     colonoscopy was performed with moderate difficulty due to                     poor bowel prep. Successful completion of the procedure                     was aided by lavage. The patient tolerated the procedure                     well. The quality of the bowel preparation was good except                     the ascending colon was fair. Findings:      Multiple small-mouthed diverticula were found in the sigmoid colon, in       the descending colon and in the distal transverse colon.      A 2 mm polyp was found in the descending colon. The polyp was sessile.       The polyp was removed with a cold biopsy forceps. Resection and       retrieval were complete.      A localized area of mildly granular mucosa was found in the transverse     colon. Biopsies were taken with a cold forceps for histology. Area was       tattooed with an injection of 2 mL of [place] ink.  A 2 mm polyp was found in the proximal transverse colon. The polyp was       sessile. The polyp was removed with a cold biopsy forceps. Resection and       retrieval were complete.      A 2 mm polyp was found in the mid sigmoid colon. The polyp was sessile.       The polyp was removed with a cold biopsy forceps. Resection and       retrieval were complete.      Two sessile polyps were found in the distal sigmoid colon. The polyps       were 1 to 2 mm in size. These polyps were removed with a cold biopsy       forceps. Resection and retrieval were complete.      Non-bleeding internal hemorrhoids were found during retroflexion. The       hemorrhoids were Grade I (internal hemorrhoids that do not prolapse).      The digital rectal exam was normal. Impression:        - Diverticulosis in the sigmoid colon, in the descending                     colon and in the distal transverse colon.                    - One 2 mm polyp in the descending colon. Resected and                     retrieved.                    - Granular mucosa in the transverse colon. Biopsied.                     Tattooed.                    - One 2 mm polyp in the proximal transverse colon.                     Resected and retrieved.                    - One 2 mm polyp in the mid sigmoid colon. Resected and                     retrieved.                    - Two 1 to 2 mm polyps in the distal sigmoid colon.                     Resected and retrieved.                    - Non-bleeding internal hemorrhoids. Recommendation:    - Await pathology results.                    - Telephone GI clinic for pathology results in 1 week.                    - Full liquid diet today.                    - Soft diet for 3 days. Procedure Code(s): --- Professional ---  [id number], Colonoscopy,  flexible; with biopsy, single or                     multiple                    45381, Colonoscopy, flexible; with directed submucosal                     injection(s), any substance Diagnosis Code(s): --- Professional ---                    K64.0, First degree hemorrhoids                    D12.4, Benign neoplasm of descending colon                    D12.3, Benign neoplasm of transverse colon                    D12.5, Benign neoplasm of sigmoid colon                    K63.89, Other specified diseases of intestine                    Z86.010, Personal history of colonic polyps                    K57.30, Diverticulosis of large intestine without                     perforation or abscess without bleeding CPT copyright 2014 American Medical Association. All rights reserved. The codes documented in this report are preliminary and upon coder review may  be revised to meet current compliance requirements. Lollie Sails, MD 12/26/2014 3:26:06 PM This report has been signed electronically. Number of Addenda: 0 Note Initiated On: 12/26/2014 2:29 PM Scope Withdrawal Time: 0 hours 9 minutes 51 seconds  Total Procedure Duration: 0 hours 26 minutes 57 seconds       [facility]

## 2014-12-26 NOTE — Transfer of Care (Signed)
Immediate Anesthesia Transfer of Care Note  Patient: Louis Fuller  Procedure(s) Performed: Procedure(s): COLONOSCOPY WITH PROPOFOL (N/A) ESOPHAGOGASTRODUODENOSCOPY (EGD) WITH PROPOFOL (N/A)  Patient Location: PACU  Anesthesia Type:General  Level of Consciousness: awake, alert , oriented and patient cooperative  Airway & Oxygen Therapy: Patient Spontanous Breathing and Patient connected to nasal cannula oxygen  Post-op Assessment: Report given to RN and Post -op Vital signs reviewed and stable  Post vital signs: Reviewed and stable  Last Vitals:  Filed Vitals:   12/26/14 1346 12/26/14 1529  BP: 100/55 118/76  Pulse: 80 93  Temp: 36.8 C 36 C  Resp: 18 14    Complications: No apparent anesthesia complications

## 2014-12-26 NOTE — Op Note (Signed)
[facility] Gastroenterology Patient Name: Louis Fuller Procedure Date: 12/26/2014 2:30 PM MRN: [id number] Account #: [id number] Date of Birth: 1945/11/14 Admit Type: Outpatient Age: 69 Room: [facility] ENDO ROOM 3 Gender: Male Note Status: Finalized Procedure:         Upper GI endoscopy Indications:       Gastro-esophageal reflux disease Providers:         Lollie Sails, MD Referring MD:      No Local Md, MD (Referring MD) Medicines:         Monitored Anesthesia Care Complications:     No immediate complications. Procedure:         Pre-Anesthesia Assessment:                    - ASA Grade Assessment: III - A patient with severe                     systemic disease.                    After obtaining informed consent, the endoscope was passed                     under direct vision. Throughout the procedure, the                     patient's blood pressure, pulse, and oxygen saturations                     were monitored continuously. The Endoscope was introduced                     through the mouth, and advanced to the third part of                     duodenum. The upper GI endoscopy was accomplished without                     difficulty. The patient tolerated the procedure well. Findings:      The examined esophagus was normal.      The Z-line was variable. Biopsies were taken with a cold forceps for       histology.      Localized mild inflammation characterized by erythema was found in the       cardia.      Patchy mild inflammation characterized by erosions was found at the       incisura and in the gastric antrum. Biopsies were taken with a cold       forceps for histology. Biopsies were taken with a cold forceps for       Helicobacter pylori testing.      The cardia and gastric fundus were normal on retroflexion.      The examined duodenum was normal. Impression:        - Normal esophagus.                    - Z-line variable. Biopsied.             - Gastritis.                    - Erosive gastritis. Biopsied.                    - Normal examined duodenum. Recommendation:    -  Await pathology results.                    - Continue present medications.                    - Telephone GI clinic for pathology results in 1 week. Procedure Code(s): --- Professional ---                    [phone], Esophagogastroduodenoscopy, flexible, transoral;                     with biopsy, single or multiple Diagnosis Code(s): --- Professional ---                    K22.8, Other specified diseases of esophagus                    K29.70, Gastritis, unspecified, without bleeding                    K29.60, Other gastritis without bleeding                    K21.9, Gastro-esophageal reflux disease without esophagitis CPT copyright 2014 American Medical Association. All rights reserved. The codes documented in this report are preliminary and upon coder review may  be revised to meet current compliance requirements. Lollie Sails, MD 12/26/2014 2:47:33 PM This report has been signed electronically. Number of Addenda: 0 Note Initiated On: 12/26/2014 2:30 PM      [facility]

## 2014-12-26 NOTE — H&P (Signed)
Outpatient short stay form Pre-procedure 12/26/2014 2:23 PM Lollie Sails MD  Primary Physician: Dr. Ailene Ravel  Reason for visit:  EGD and colonoscopy  History of present illness:  Patient is a 69 year old male presenting today for EGD and colonoscopy. Couple of months ago he tried to discontinue his proton pump inhibitor however had go back on it because of reflux and atypical chest discomfort. Also has a personal history of adenomatous colon polyps. His last colonoscopy was 01/15/2009. He tolerated his prep well. He does take L) stopped 2 and half days ago. Takes no other anticoagulation medications are aspirin. Does take a beta blocker. He did have cardiac clearance for this procedure.    Current facility-administered medications:  .  0.9 %  sodium chloride infusion, , Intravenous, Continuous, Lollie Sails, MD, Last Rate: 20 mL/hr at 12/26/14 1401, 1,000 mL at 12/26/14 1401 .  0.9 %  sodium chloride infusion, , Intravenous, Continuous, Lollie Sails, MD  Prescriptions prior to admission  Medication Sig Dispense Refill Last Dose  . apixaban (ELIQUIS) 5 MG TABS tablet Take 1 tablet (5 mg total) by mouth 2 (two) times daily. 180 tablet 3 Past Week at Unknown time  . atorvastatin (LIPITOR) 40 MG tablet TAKE 1 TABLET DAILY 90 tablet 3 12/25/2014 at Unknown time  . Carboxymethylcellul-Glycerin (REFRESH OPTIVE OP) Place 1 drop into both eyes daily as needed (for itchy eyes).    12/25/2014 at Unknown time  . digoxin (LANOXIN) 0.25 MG tablet Take 1 tablet (0.25 mg total) by mouth daily. 90 tablet 3 12/26/2014 at 0700  . metoprolol succinate (TOPROL-XL) 100 MG 24 hr tablet TAKE 1 TABLET DAILY (TAKE WITH OR IMMEDIATELY FOLLOWING A MEAL) 90 tablet 3 12/26/2014 at 0700  . Multiple Vitamin (MULTIVITAMIN) tablet Take 1 tablet by mouth daily.     12/25/2014 at Unknown time  . omeprazole (PRILOSEC) 20 MG capsule Take 20 mg by mouth daily.     12/25/2014 at Unknown time  . propranolol (INDERAL)  10 MG tablet Take 10 mg by mouth 4 (four) times daily as needed (for heart).   Past Month at Unknown time     No Known Allergies   Past Medical History  Diagnosis Date  . Longstanding persistent atrial fibrillation ([place])   . Mitral regurgitation   . Varicose veins   . ED (erectile dysfunction)   . GERD (gastroesophageal reflux disease)   . Hypercholesterolemia   . Excessive drinking of alcohol   . Peripheral vascular disease ([place])   . Obesity   . Arthritis   . 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 I have discussed the risks benefits and complications of procedures to include not limited to bleeding, infection, perforation and the risk of sedation and the patient wishes to proceed.    Lollie Sails, MD Gastroenterology 12/26/2014  2:23 PM

## 2014-12-27 ENCOUNTER — Encounter: Payer: Self-pay | Admitting: Gastroenterology

## 2014-12-29 LAB — SURGICAL PATHOLOGY

## 2015-01-19 ENCOUNTER — Other Ambulatory Visit: Payer: Self-pay | Admitting: Cardiovascular Disease

## 2015-01-24 ENCOUNTER — Encounter: Payer: Self-pay | Admitting: *Deleted

## 2015-03-13 ENCOUNTER — Encounter: Payer: Self-pay | Admitting: Cardiovascular Disease

## 2015-03-13 ENCOUNTER — Other Ambulatory Visit (INDEPENDENT_AMBULATORY_CARE_PROVIDER_SITE_OTHER): Payer: Medicare Other | Admitting: *Deleted

## 2015-03-13 ENCOUNTER — Ambulatory Visit (INDEPENDENT_AMBULATORY_CARE_PROVIDER_SITE_OTHER): Payer: Medicare Other | Admitting: Cardiovascular Disease

## 2015-03-13 VITALS — BP 120/88 | HR 68 | Ht 71.0 in | Wt 267.4 lb

## 2015-03-13 DIAGNOSIS — I481 Persistent atrial fibrillation: Secondary | ICD-10-CM | POA: Diagnosis not present

## 2015-03-13 DIAGNOSIS — I4819 Other persistent atrial fibrillation: Secondary | ICD-10-CM

## 2015-03-13 DIAGNOSIS — E785 Hyperlipidemia, unspecified: Secondary | ICD-10-CM | POA: Diagnosis not present

## 2015-03-13 DIAGNOSIS — E7800 Pure hypercholesterolemia, unspecified: Secondary | ICD-10-CM | POA: Diagnosis not present

## 2015-03-13 LAB — HEPATIC FUNCTION PANEL
ALT: 34 U/L (ref 9–46)
AST: 24 U/L (ref 10–35)
Albumin: 3.7 g/dL (ref 3.6–5.1)
Alkaline Phosphatase: 90 U/L (ref 40–115)
BILIRUBIN DIRECT: 0.1 mg/dL (ref <=0.2)
BILIRUBIN TOTAL: 0.8 mg/dL (ref 0.2–1.2)
Indirect Bilirubin: 0.7 mg/dL (ref 0.2–1.2)
Total Protein: 6.9 g/dL (ref 6.1–8.1)

## 2015-03-13 LAB — BASIC METABOLIC PANEL
BUN: 14 mg/dL (ref 7–25)
CO2: 27 mmol/L (ref 20–31)
Calcium: 9.5 mg/dL (ref 8.6–10.3)
Chloride: 103 mmol/L (ref 98–110)
Creat: 0.93 mg/dL (ref 0.70–1.25)
GLUCOSE: 94 mg/dL (ref 65–99)
POTASSIUM: 4.5 mmol/L (ref 3.5–5.3)
SODIUM: 137 mmol/L (ref 135–146)

## 2015-03-13 LAB — LIPID PANEL
CHOL/HDL RATIO: 4.4 ratio (ref <=5.0)
Cholesterol: 149 mg/dL (ref 125–200)
HDL: 34 mg/dL — ABNORMAL LOW (ref >=40)
LDL Cholesterol: 82 mg/dL (ref <130)
Triglycerides: 167 mg/dL — ABNORMAL HIGH (ref <150)
VLDL: 33 mg/dL — AB (ref <30)

## 2015-03-13 MED ORDER — APIXABAN 5 MG PO TABS: 5.0000 mg | ORAL_TABLET | Freq: Two times a day (BID) | ORAL | Status: DC

## 2015-03-13 MED ORDER — PROPRANOLOL HCL 10 MG PO TABS
10.0000 mg | ORAL_TABLET | Freq: Four times a day (QID) | ORAL | Status: DC | PRN
Start: 2015-03-13 — End: 2016-09-04

## 2015-03-13 MED ORDER — DIGOXIN 250 MCG PO TABS: 0.2500 mg | ORAL_TABLET | Freq: Every day | ORAL | Status: DC

## 2015-03-13 MED ORDER — METOPROLOL SUCCINATE ER 100 MG PO TB24: 100.0000 mg | ORAL_TABLET | Freq: Every day | ORAL | Status: DC

## 2015-03-13 MED ORDER — ATORVASTATIN CALCIUM 40 MG PO TABS: 40.0000 mg | ORAL_TABLET | Freq: Every day | ORAL | Status: DC

## 2015-03-13 NOTE — Patient Instructions (Signed)
Medication Instructions:  Your physician recommends that you continue on your current medications as directed. Please refer to the Current Medication list given to you today.   Labwork: TODAY - cholesterol, complete metabolic panel   Testing/Procedures: None Ordered   Follow-Up: Your physician wants you to follow-up in: 6 months with Dr. Nahser.  You will receive a reminder letter in the mail two months in advance. If you don't receive a letter, please call our office to schedule the follow-up appointment.   If you need a refill on your cardiac medications before your next appointment, please call your pharmacy.   Thank you for choosing [facility]! Mega Kinkade, RN [phone]    

## 2015-03-13 NOTE — Progress Notes (Signed)
Cardiology Office Note   Date:  03/13/2015   ID:  [name], DOB 21-Sep-1945, MRN [id number]  PCP:  Marcelina Morel  Cardiologist:   Acie Fredrickson Wonda Cheng, MD   Chief Complaint  Patient presents with  . Follow-up    A-Fib   1. Paroxysmal atrial fibrillation 2. Hypertension 3. Hyperlipidemia 4. Knee arthritis - needs knee replacement  History of Present Illness:  Louis Fuller is a 70 y.o. gentleman with a Hx of paroxysmal Atrial Fibrillation. He notices it significant heart irregularities after he comes in from own grass or doing other yard work. He takes an extra half of of Toprol that seemed to help. He gets some extra exercise  March 04, 2012:  Louis Fuller is doing well. He has some rare episodes of palpitations that clinically sound like atrial fib. The episode lasted most of the night. He takes propranolol on occasion for these episodes of A-Fib.  July 05, 2013:  He has moved to [place] since I last saw him. He needs to have knee replacements.  He has vericose veins in his legs and has leg swelling. No CP, no dyspnea.  He is in A-fib today at his office visit.   August 03, 2013:  He is still in atrial fib. He is basically asymptomatic. He's able to do all of his normal activities without any problems. He denies any chest pain or shortness of breath. He has not had his myoview yet.  Echo revealed. Normal left ventricular systolic function and mild mitral regurgitation, trivial aortic insufficiency.   August 23, 2013: Louis Fuller is seen back - he is still A. symptomatically and basically cannot tell that is in atrial fibrillation. He remains in atrial fibrillation. He's able to do all his normal activities without any issues. He's tolerating the Flecainide well. He's on Eliquis and has not noted any bleeding.  Sept. 1, 2015: Louis Fuller is seen - doing well with current meds.  Ready to have a cardioversion scheduled.  Nov. 3, 2015: Louis Fuller had a successful cardioversion  about a month or so ago. Several days after that he felt that his heart was beating correctly. He is seen today for followup and in fact is back in atrial fibrillation. He continues to be asymptomatic.    Jun 15, 2014: Louis Fuller is a 70 y.o. male who presents for follow-up of his atrial fibrillation. He has been seen by Dr. Rayann Heman for consideration for A. fib ablation. Given his lack of symptoms, A. fib ablation is not currently indicated. He declined one to start Tikosyn. His echocardiogram in June, 2015 showed normal left ventricle systolic function.  September 14, 2014:  Doing well.  No symptoms  Limited by knee pain .   Feb. 14, 2017: Doing well from a cardiac standpoint.   Lots of other issues  - was found to have a precancerous lesion on his colon.  Needs to have 4 teeth removed. Will hold his Eliquis for 2 days prior.   Wife, Louis Fuller had a mass removed from her head.   Might be an infection .    Past Medical History  Diagnosis Date  . Longstanding persistent atrial fibrillation ([place])   . Mitral regurgitation   . Varicose veins   . ED (erectile dysfunction)   . GERD (gastroesophageal reflux disease)   . Hypercholesterolemia   . Excessive drinking of alcohol   . Peripheral vascular disease ([place])   . Obesity   . Arthritis   . Hypogonadism in male  consistent with testicular failure    Past Surgical History  Procedure Laterality Date  . Cataract extraction      left  . Vein laser surgery    . Cataract extraction    . Cardioversion N/A 10/06/2013    Procedure: CARDIOVERSION;  Surgeon: Lelon Perla, MD;  Location: [place];  Service: Cardiovascular;  Laterality: N/A;  . Eye surgery    . Colonoscopy    . Colonoscopy with propofol N/A 12/26/2014    Procedure: COLONOSCOPY WITH PROPOFOL;  Surgeon: Lollie Sails, MD;  Location: [facility] ENDOSCOPY;  Service: Endoscopy;  Laterality: N/A;  . Esophagogastroduodenoscopy (egd) with propofol N/A 12/26/2014     Procedure: ESOPHAGOGASTRODUODENOSCOPY (EGD) WITH PROPOFOL;  Surgeon: Lollie Sails, MD;  Location: [facility] ENDOSCOPY;  Service: Endoscopy;  Laterality: N/A;     Current Outpatient Prescriptions  Medication Sig Dispense Refill  . atorvastatin (LIPITOR) 40 MG tablet TAKE 1 TABLET DAILY 90 tablet 3  . Carboxymethylcellul-Glycerin (REFRESH OPTIVE OP) Place 1 drop into both eyes daily as needed (for itchy eyes).     [name] Kitchen digoxin (LANOXIN) 0.25 MG tablet Take 1 tablet (0.25 mg total) by mouth daily. 90 tablet 3  . ELIQUIS 5 MG TABS tablet TAKE 1 TABLET TWICE A DAY 180 tablet 2  . metoprolol succinate (TOPROL-XL) 100 MG 24 hr tablet TAKE 1 TABLET DAILY (TAKE WITH OR IMMEDIATELY FOLLOWING A MEAL) 90 tablet 3  . Multiple Vitamin (MULTIVITAMIN) tablet Take 1 tablet by mouth daily.      [name] Kitchen omeprazole (PRILOSEC) 20 MG capsule Take 20 mg by mouth daily.      . propranolol (INDERAL) 10 MG tablet Take 10 mg by mouth 4 (four) times daily as needed (for heart).     No current facility-administered medications for this visit.    Allergies:   Review of patient's allergies indicates no known allergies.    Social History:  The patient  reports that he has never smoked. He has never used smokeless tobacco. He reports that he drinks about 8.4 oz of alcohol per week. He reports that he does not use illicit drugs.   Family History:  The patient's Family history is unknown by patient.    ROS:  Please see the history of present illness.    Review of Systems: Constitutional:  denies fever, chills, diaphoresis, appetite change and fatigue.  HEENT: denies photophobia, eye pain, redness, hearing loss, ear pain, congestion, sore throat, rhinorrhea, sneezing, neck pain, neck stiffness and tinnitus.  Respiratory: denies SOB, DOE, cough, chest tightness, and wheezing.  Cardiovascular: denies chest pain, palpitations and leg swelling.  Gastrointestinal: denies nausea, vomiting, abdominal pain, diarrhea, constipation,  blood in stool.  Genitourinary: denies dysuria, urgency, frequency, hematuria, flank pain and difficulty urinating.  Musculoskeletal: denies  myalgias, back pain, joint swelling, arthralgias and gait problem.   Skin: denies pallor, rash and wound.  Neurological: denies dizziness, seizures, syncope, weakness, light-headedness, numbness and headaches.   Hematological: denies adenopathy, easy bruising, personal or family bleeding history.  Psychiatric/ Behavioral: denies suicidal ideation, mood changes, confusion, nervousness, sleep disturbance and agitation.       All other systems are reviewed and negative.    PHYSICAL EXAM: VS:  BP 120/88 mmHg  Pulse 68  Ht 5\' 11"  (1.803 m)  Wt 267 lb 6.4 oz (121.292 kg)  BMI 37.31 kg/m2 , BMI Body mass index is 37.31 kg/(m^2). GEN: Well nourished, well developed, in no acute distress HEENT: normal Neck: no JVD, carotid bruits, or masses Cardiac: Irreg.  Irreg ; no murmurs, rubs, or gallops, trace  edema  Respiratory:  clear to auscultation bilaterally, normal work of breathing GI: soft, nontender, nondistended, + BS MS: no deformity or atrophy Skin: warm and dry, no rash Neuro:  Strength and sensation are intact Psych: normal   EKG:  EKG is ordered today. The ekg ordered today demonstrates atrial fibrillation with a rate of 98.   Recent Labs: No results found for requested labs within last 365 days.    Lipid Panel    Component Value Date/Time   CHOL 153 11/30/2013 0802   CHOL 142 12/11/2010 1035   TRIG 106 11/30/2013 0802   HDL 57 11/30/2013 0802   HDL 55.80 12/11/2010 1035   CHOLHDL 2.7 11/30/2013 0802   CHOLHDL 3 12/11/2010 1035   VLDL 13.8 12/11/2010 1035   LDLCALC 75 11/30/2013 0802   LDLCALC 72 12/11/2010 1035       Wt Readings from Last 3 Encounters:  03/13/15 267 lb 6.4 oz (121.292 kg)  12/26/14 270 lb (122.471 kg)  09/14/14 277 lb (125.646 kg)      Other studies Reviewed: Additional studies/ records that were  reviewed today include: . Review of the above records demonstrates:    ASSESSMENT AND PLAN:  1. Persistent atrial fibrillation -  Louis Fuller is doing well.  Has chronic atrial fib.  He is completely asymptomatic. Continue current meds.    2. Hypertension - blood pressure is in the low-normal range.  3. Hyperlipidemia - will check fasting lipids at his next office visit .   4. Knee arthritis - needs knee replacement   Current medicines are reviewed at length with the patient today.  The patient does not have concerns regarding medicines.  The following changes have been made:  no change  Labs/ tests ordered today include:   No orders of the defined types were placed in this encounter.     Disposition:   FU with me in 6 months       Morgin Halls, Wonda Cheng, MD  03/13/2015 11:23 AM    [address] [place] [place], [place], [place]  [address] Phone: [phone]; Fax: [phone]   [facility]  [address] [place],   [address] [phone]    Fax [phone]

## 2015-03-26 ENCOUNTER — Encounter: Payer: Self-pay | Admitting: *Deleted

## 2015-03-27 ENCOUNTER — Ambulatory Visit: Payer: Medicare Other | Admitting: Anesthesiology

## 2015-03-27 ENCOUNTER — Encounter: Payer: Self-pay | Admitting: Gastroenterology

## 2015-03-27 ENCOUNTER — Encounter
Admission: RE | Disposition: A | Discharge status: Home / Self Care | Payer: Self-pay | Source: Ambulatory Visit | Attending: Gastroenterology

## 2015-03-27 ENCOUNTER — Ambulatory Visit
Admission: RE | Admit: 2015-03-27 | Discharge: 2015-03-27 | Disposition: A | Discharge status: Home / Self Care | Payer: Medicare Other | Source: Ambulatory Visit | Attending: Gastroenterology | Admitting: Gastroenterology

## 2015-03-27 DIAGNOSIS — M1990 Unspecified osteoarthritis, unspecified site: Secondary | ICD-10-CM | POA: Insufficient documentation

## 2015-03-27 DIAGNOSIS — K219 Gastro-esophageal reflux disease without esophagitis: Secondary | ICD-10-CM | POA: Diagnosis not present

## 2015-03-27 DIAGNOSIS — K5730 Diverticulosis of large intestine without perforation or abscess without bleeding: Secondary | ICD-10-CM | POA: Diagnosis not present

## 2015-03-27 DIAGNOSIS — Z79899 Other long term (current) drug therapy: Secondary | ICD-10-CM | POA: Insufficient documentation

## 2015-03-27 DIAGNOSIS — D120 Benign neoplasm of cecum: Secondary | ICD-10-CM | POA: Diagnosis not present

## 2015-03-27 DIAGNOSIS — I739 Peripheral vascular disease, unspecified: Secondary | ICD-10-CM | POA: Insufficient documentation

## 2015-03-27 DIAGNOSIS — Z7901 Long term (current) use of anticoagulants: Secondary | ICD-10-CM | POA: Insufficient documentation

## 2015-03-27 DIAGNOSIS — N529 Male erectile dysfunction, unspecified: Secondary | ICD-10-CM | POA: Diagnosis not present

## 2015-03-27 DIAGNOSIS — D123 Benign neoplasm of transverse colon: Secondary | ICD-10-CM | POA: Insufficient documentation

## 2015-03-27 DIAGNOSIS — E7800 Pure hypercholesterolemia, unspecified: Secondary | ICD-10-CM | POA: Insufficient documentation

## 2015-03-27 DIAGNOSIS — Z6837 Body mass index (BMI) 37.0-37.9, adult: Secondary | ICD-10-CM | POA: Diagnosis not present

## 2015-03-27 HISTORY — PX: COLONOSCOPY WITH PROPOFOL: SHX5780

## 2015-03-27 HISTORY — DX: Gastritis, unspecified, without bleeding: K29.70

## 2015-03-27 HISTORY — DX: Polyp of colon: K63.5

## 2015-03-27 LAB — CBC
HEMATOCRIT: 41.8 % (ref 40.0–52.0)
HEMOGLOBIN: 14.2 g/dL (ref 13.0–18.0)
MCH: 31.6 pg (ref 26.0–34.0)
MCHC: 34 g/dL (ref 32.0–36.0)
MCV: 92.9 fL (ref 80.0–100.0)
Platelets: 200 10*3/uL (ref 150–440)
RBC: 4.5 MIL/uL (ref 4.40–5.90)
RDW: 13.7 % (ref 11.5–14.5)
WBC: 8.6 10*3/uL (ref 3.8–10.6)

## 2015-03-27 LAB — PROTIME-INR
INR: 1.03
Prothrombin Time: 13.7 seconds (ref 11.4–15.0)

## 2015-03-27 SURGERY — COLONOSCOPY WITH PROPOFOL
Anesthesia: General

## 2015-03-27 MED ORDER — SODIUM CHLORIDE 0.9 % IV SOLN
INTRAVENOUS | Status: DC
  Administered 2015-03-27: 1000 mL via INTRAVENOUS

## 2015-03-27 MED ORDER — SODIUM CHLORIDE 0.9 % IV SOLN: INTRAVENOUS | Status: DC

## 2015-03-27 MED ORDER — LIDOCAINE HCL (CARDIAC) 20 MG/ML IV SOLN
INTRAVENOUS | Status: DC | PRN
  Administered 2015-03-27: 60 mg via INTRAVENOUS

## 2015-03-27 MED ORDER — MIDAZOLAM HCL 2 MG/2ML IJ SOLN
INTRAMUSCULAR | Status: DC | PRN
  Administered 2015-03-27: 1 mg via INTRAVENOUS

## 2015-03-27 MED ORDER — PROPOFOL 10 MG/ML IV BOLUS
INTRAVENOUS | Status: DC | PRN
  Administered 2015-03-27: 20 mg via INTRAVENOUS
  Administered 2015-03-27 (×4): 30 mg via INTRAVENOUS

## 2015-03-27 MED ORDER — PROPOFOL 500 MG/50ML IV EMUL
INTRAVENOUS | Status: DC | PRN
  Administered 2015-03-27: 140 ug/kg/min via INTRAVENOUS

## 2015-03-27 NOTE — Transfer of Care (Signed)
Immediate Anesthesia Transfer of Care Note  Patient: Louis Fuller  Procedure(s) Performed: Procedure(s): COLONOSCOPY WITH PROPOFOL (N/A)  Patient Location: PACU  Anesthesia Type:General  Level of Consciousness: awake and alert   Airway & Oxygen Therapy: Patient Spontanous Breathing  Post-op Assessment: Report given to RN and Post -op Vital signs reviewed and stable  Post vital signs: Reviewed and stable  Last Vitals:  Filed Vitals:   03/27/15 0759 03/27/15 0957  BP: 122/83 119/82  Pulse: 107 78  Temp: 36.6 C 36.7 C  Resp: 16 19    Complications: No apparent anesthesia complications

## 2015-03-27 NOTE — Anesthesia Preprocedure Evaluation (Signed)
Anesthesia Evaluation  Patient identified by MRN, date of birth, ID band Patient awake    Reviewed: Allergy & Precautions, H&P , NPO status , Patient's Chart, lab work & pertinent test results, reviewed documented beta blocker date and time   Airway Mallampati: II   Neck ROM: full    Dental  (+) Poor Dentition   Pulmonary neg pulmonary ROS,    Pulmonary exam normal        Cardiovascular + Peripheral Vascular Disease  negative cardio ROS Normal cardiovascular examAtrial Fibrillation      Neuro/Psych negative neurological ROS  negative psych ROS   GI/Hepatic negative GI ROS, Neg liver ROS, GERD  ,  Endo/Other  negative endocrine ROSMorbid obesity  Renal/GU negative Renal ROS  negative genitourinary   Musculoskeletal   Abdominal   Peds  Hematology negative hematology ROS (+)   Anesthesia Other Findings Past Medical History:   Longstanding persistent atrial fibrillation (H*              Mitral regurgitation                                         Varicose veins                                               ED (erectile dysfunction)                                    GERD (gastroesophageal reflux disease)                       Hypercholesterolemia                                         Excessive drinking of alcohol                                Peripheral vascular disease (HCC)                            Obesity                                                      Arthritis                                                    Hypogonadism in male                                           Comment:consistent with testicular failure   Gastritis  Colon polyp                                                Past Surgical History:   CATARACT EXTRACTION                                             Comment:left   vein laser surgery                                           CATARACT EXTRACTION                                           CARDIOVERSION                                   N/A 10/06/2013      Comment:Procedure: CARDIOVERSION;  Surgeon: Lelon Perla, MD;  Location: [facility];                Service: Cardiovascular;  Laterality: N/A;   EYE SURGERY                                                   COLONOSCOPY                                                   COLONOSCOPY WITH PROPOFOL                       N/A 12/26/2014     Comment:Procedure: COLONOSCOPY WITH PROPOFOL;  Surgeon:              Lollie Sails, MD;  Location: [facility]               ENDOSCOPY;  Service: Endoscopy;  Laterality:               N/A;   ESOPHAGOGASTRODUODENOSCOPY (EGD) WITH PROPOFOL  N/A 12/26/2014     Comment:Procedure: ESOPHAGOGASTRODUODENOSCOPY (EGD)               WITH PROPOFOL;  Surgeon: Lollie Sails, MD;              Location: [facility] ENDOSCOPY;  Service: Endoscopy;               Laterality: N/A;   ENDOVENOUS ABLATION SAPHENOUS VEIN W/ LASER                 BMI    Body Mass Index   37.67 kg/m 2     Reproductive/Obstetrics  Anesthesia Physical Anesthesia Plan  ASA: III  Anesthesia Plan: General   Post-op Pain Management:    Induction:   Airway Management Planned:   Additional Equipment:   Intra-op Plan:   Post-operative Plan:   Informed Consent: I have reviewed the patients History and Physical, chart, labs and discussed the procedure including the risks, benefits and alternatives for the proposed anesthesia with the patient or authorized representative who has indicated his/her understanding and acceptance.   Dental Advisory Given  Plan Discussed with: CRNA  Anesthesia Plan Comments:         Anesthesia Quick Evaluation

## 2015-03-27 NOTE — Anesthesia Procedure Notes (Signed)
Date/Time: 03/27/2015 9:06 AM Performed by: Johnna Acosta Pre-anesthesia Checklist: Patient identified, Emergency Drugs available, Suction available, Patient being monitored and Timeout performed Patient Re-evaluated:Patient Re-evaluated prior to inductionOxygen Delivery Method: Nasal cannula

## 2015-03-27 NOTE — Anesthesia Postprocedure Evaluation (Signed)
Anesthesia Post Note  Patient: Louis Fuller  Procedure(s) Performed: Procedure(s) (LRB): COLONOSCOPY WITH PROPOFOL (N/A)  Patient location during evaluation: PACU Anesthesia Type: General Level of consciousness: awake and alert Pain management: pain level controlled Vital Signs Assessment: post-procedure vital signs reviewed and stable Respiratory status: spontaneous breathing, nonlabored ventilation, respiratory function stable and patient connected to nasal cannula oxygen Cardiovascular status: blood pressure returned to baseline and stable Postop Assessment: no signs of nausea or vomiting Anesthetic complications: no    Last Vitals:  Filed Vitals:   03/27/15 1019 03/27/15 1029  BP: 109/85 122/85  Pulse: 70 69  Temp:    Resp: 17 16    Last Pain: There were no vitals filed for this visit.               Molli Barrows

## 2015-03-27 NOTE — Op Note (Signed)
[facility] Patient Name: Louis Fuller Procedure Date: 03/27/2015 9:02 AM MRN: [id number] Account #: [id number] Date of Birth: Dec 28, 1945 Admit Type: Outpatient Age: 70 Room: [facility] ENDO ROOM 3 Gender: Male Note Status: Finalized Procedure:            Colonoscopy Indications:          Adenomatous polyps in the colon, Follow-up for history                        of adenomatous polyps in the colon, For therapy of                        adenomatous polyps in the colon Providers:            Lollie Sails, MD Referring MD:         Marcelina Morel, MD (Referring MD) Medicines:            Monitored Anesthesia Care Complications:        No immediate complications. Procedure:            Pre-Anesthesia Assessment:                       - ASA Grade Assessment: III - A patient with severe                        systemic disease.                       After obtaining informed consent, the colonoscope was                        passed under direct vision. Throughout the procedure,                        the patient's blood pressure, pulse, and oxygen                        saturations were monitored continuously. The                        Colonoscope was introduced through the anus and                        advanced to the the cecum, identified by appendiceal                        orifice and ileocecal valve. The colonoscopy was                        performed with moderate difficulty. The patient                        tolerated the procedure well. The quality of the bowel                        preparation was fair. Findings:      Multiple small-mouthed diverticula were found in the sigmoid colon,       descending colon and transverse colon.      A 2 mm polyp was found in the transverse colon. The polyp was sessile.  The polyp was removed with a cold biopsy forceps. Resection and       retrieval were complete.      Two sessile polyps  were found in the cecum. The polyps were less than 1       mm in size. These polyps were removed with a cold biopsy forceps.       Resection and retrieval were complete.      A 3 mm polyp was found in the transverse colon. The polyp was sessile.       The polyp was removed with a cold biopsy forceps. Resection and       retrieval were complete.      A 4 mm polyp was found in the hepatic flexure. The polyp was sessile.       The polyp was removed with a cold biopsy forceps. Resection and       retrieval were complete.      A localized area of mildly granular mucosa was found in the proximal       transverse colon at a previously tatoo site. Biopsies were taken with a       cold forceps for histology. The area of granularity completely removed.      The retroflexed view of the distal rectum and anal verge was normal and       showed no anal or rectal abnormalities.      The digital rectal exam was normal. Impression:           - Preparation of the colon was fair.                       - Diverticulosis in the sigmoid colon, in the                        descending colon and in the transverse colon.                       - One 2 mm polyp in the transverse colon, removed with                        a cold biopsy forceps. Resected and retrieved.                       - Two less than 1 mm polyps in the cecum, removed with                        a cold biopsy forceps. Resected and retrieved.                       - One 3 mm polyp in the transverse colon, removed with                        a cold biopsy forceps. Resected and retrieved.                       - One 4 mm polyp at the hepatic flexure, removed with a                        cold biopsy forceps. Resected and retrieved.                       -  Granular mucosa in the proximal transverse colon.                        Biopsied.                       - The distal rectum and anal verge are normal on                        retroflexion  view. Recommendation:       - Await pathology results.                       - Telephone GI clinic for pathology results in 1 week. Procedure Code(s):    --- Professional ---                       [phone], Colonoscopy, flexible; with biopsy, single or                        multiple Diagnosis Code(s):    --- Professional ---                       D12.3, Benign neoplasm of transverse colon (hepatic                        flexure or splenic flexure)                       D12.0, Benign neoplasm of cecum                       K63.89, Other specified diseases of intestine                       D12.6, Benign neoplasm of colon, unspecified                       Z86.010, Personal history of colonic polyps                       K57.30, Diverticulosis of large intestine without                        perforation or abscess without bleeding CPT copyright 2016 [employer]. All rights reserved. The codes documented in this report are preliminary and upon coder review may  be revised to meet current compliance requirements. Lollie Sails, MD 03/27/2015 10:00:45 AM This report has been signed electronically. Number of Addenda: 0 Note Initiated On: 03/27/2015 9:02 AM Scope Withdrawal Time: 0 hours 32 minutes 40 seconds  Total Procedure Duration: 0 hours 41 minutes 33 seconds       [facility]

## 2015-03-27 NOTE — H&P (Signed)
Outpatient short stay form Pre-procedure 03/27/2015 8:55 AM Louis Fuller  Primary Physician: Dr. Dory Larsen  Reason for visit:  Colonoscopy  History of present illness:  Patient is a 70 year old male presenting for colonoscopy. He had a colonoscopy on 12/26/2014. There is abnormal appearing fold in the transverse colon was biopsied and tattooed. Pathology showed this to be consistent with a serrated adenoma. He is returning today for removal of this lesion.  He tolerated his prep well. He does take apixiban however has held that for over 48 hours.  Current facility-administered medications:  .  0.9 %  sodium chloride infusion, , Intravenous, Continuous, Louis Sails, Fuller, Last Rate: 20 mL/hr at 03/27/15 0835, 1,000 mL at 03/27/15 0835 .  0.9 %  sodium chloride infusion, , Intravenous, Continuous, Louis Sails, Fuller  Prescriptions prior to admission  Medication Sig Dispense Refill Last Dose  . apixaban (ELIQUIS) 5 MG TABS tablet Take 1 tablet (5 mg total) by mouth 2 (two) times daily. 180 tablet 3 Past Week at Unknown time  . atorvastatin (LIPITOR) 40 MG tablet Take 1 tablet (40 mg total) by mouth daily. 90 tablet 3 03/27/2015 at 0630  . Carboxymethylcellul-Glycerin (REFRESH OPTIVE OP) Place 1 drop into both eyes daily as needed (for itchy eyes).    Past Week at Unknown time  . digoxin (LANOXIN) 0.25 MG tablet Take 1 tablet (0.25 mg total) by mouth daily. 90 tablet 3 03/27/2015 at 0630  . metoprolol succinate (TOPROL-XL) 100 MG 24 hr tablet Take 1 tablet (100 mg total) by mouth daily. Take with or immediately following a meal. 90 tablet 3 03/27/2015 at 0630  . Multiple Vitamin (MULTIVITAMIN) tablet Take 1 tablet by mouth daily.     Past Week at Unknown time  . omeprazole (PRILOSEC) 20 MG capsule Take 20 mg by mouth daily.     03/26/2015 at Unknown time  . propranolol (INDERAL) 10 MG tablet Take 1 tablet (10 mg total) by mouth 4 (four) times daily as needed (for heart). 30  tablet 6 03/27/2015 at 0630     No Known Allergies   Past Medical History  Diagnosis Date  . Longstanding persistent atrial fibrillation ([place])   . Mitral regurgitation   . Varicose veins   . ED (erectile dysfunction)   . GERD (gastroesophageal reflux disease)   . Hypercholesterolemia   . Excessive drinking of alcohol   . Peripheral vascular disease ([place])   . Obesity   . Arthritis   . Hypogonadism in male     consistent with testicular failure  . Gastritis   . Colon polyp     Review of systems:      Physical Exam    Heart and lungs: Regular rate and rhythm without rub or gallop, lungs are bilaterally clear.    HEENT: Normocephalic atraumatic eyes are anicteric    Other:     Pertinant exam for procedure: Soft nontender nondistended bowel sounds positive normoactive.    Planned proceedures: Colonoscopy and indicated procedures. I have discussed the risks benefits and complications of procedures to include not limited to bleeding, infection, perforation and the risk of sedation and the patient wishes to proceed.    Louis Sails, [facility] 03/27/2015  8:55 AM

## 2015-03-28 LAB — SURGICAL PATHOLOGY

## 2015-06-08 IMAGING — CR DG KNEE 1-2V BILAT
1 series · 4 of 4 positions shown · non-contrast
Comparison: None.

CLINICAL DATA: Ten years of bilateral knee pain without known
injury, has been successfully treated with injections in the past.

EXAM:
BILATERAL KNEE 1-2 VIEW(S) ; BILATERAL KNEES STANDING - 1 VIEW

[Series 1: kdxr bilateral ap/lat knees · 0.14mm/px · 4 of 4 slices shown]
[im 1/4]
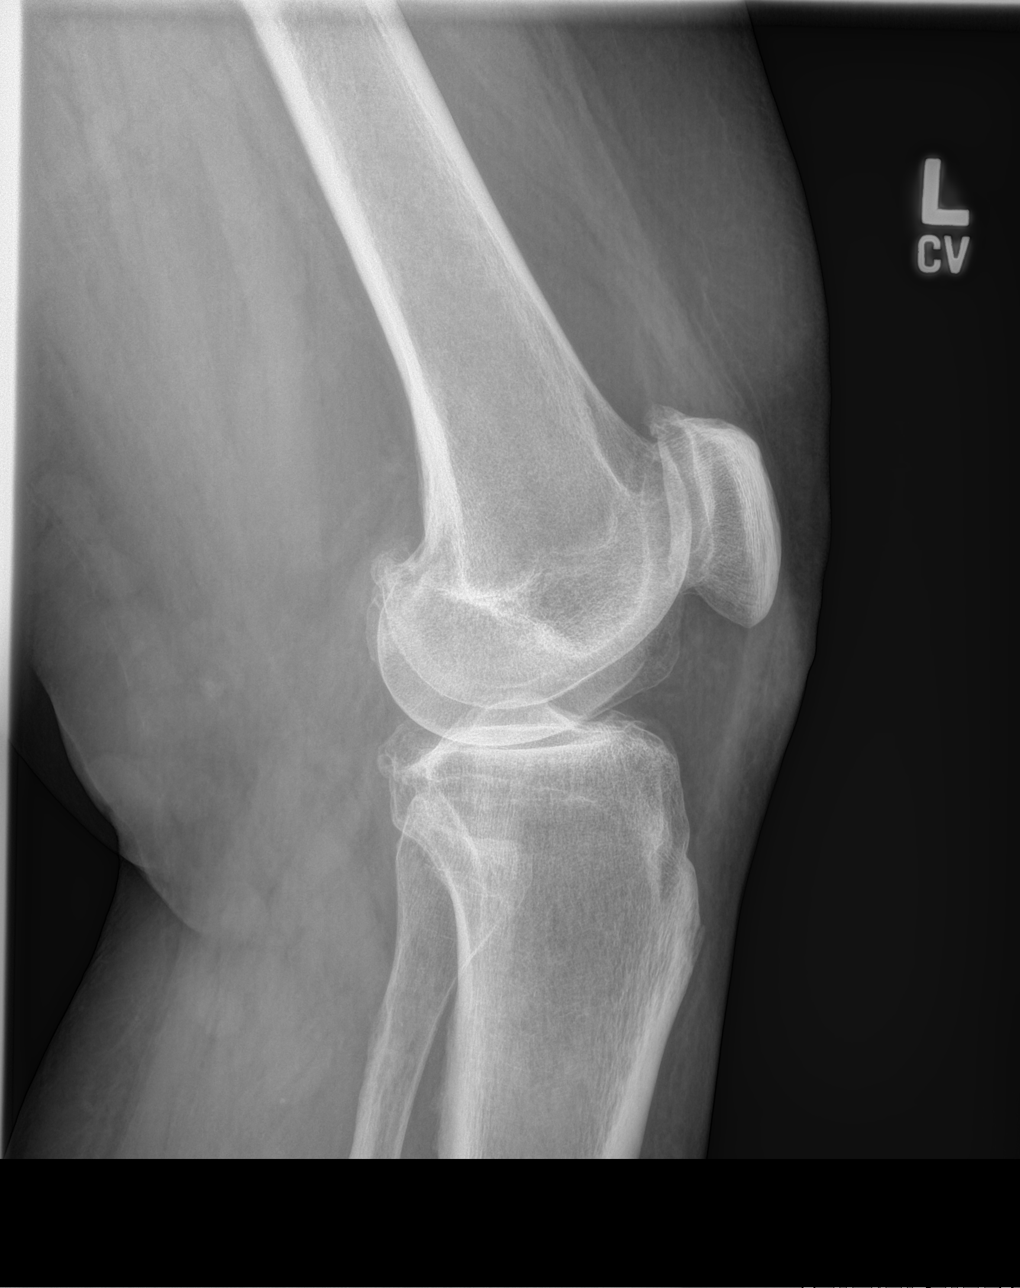
[im 2/4]
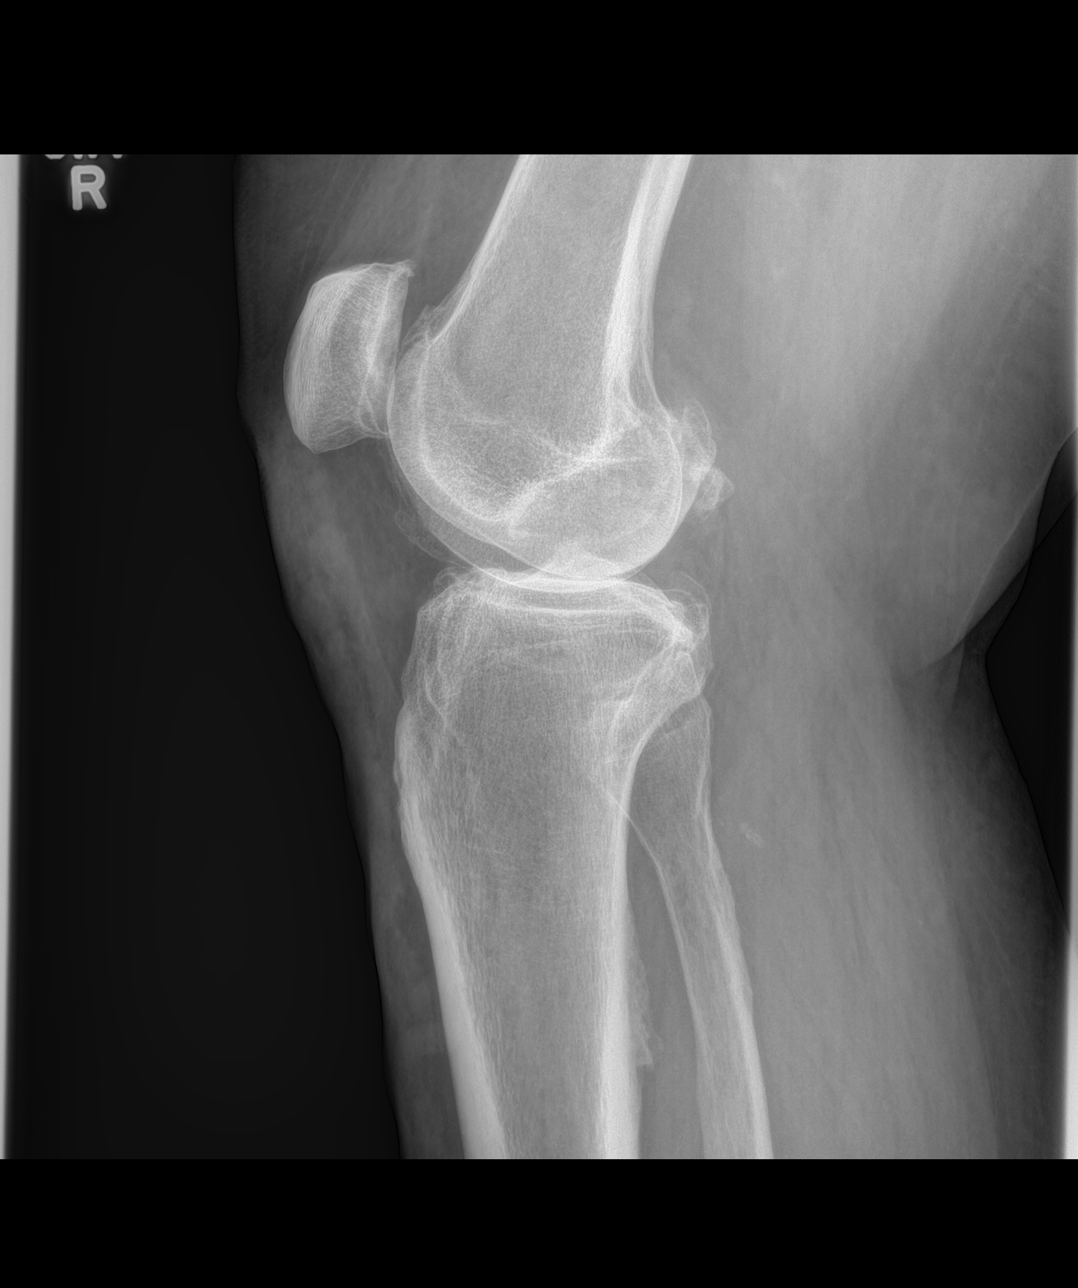
[im 3/4]
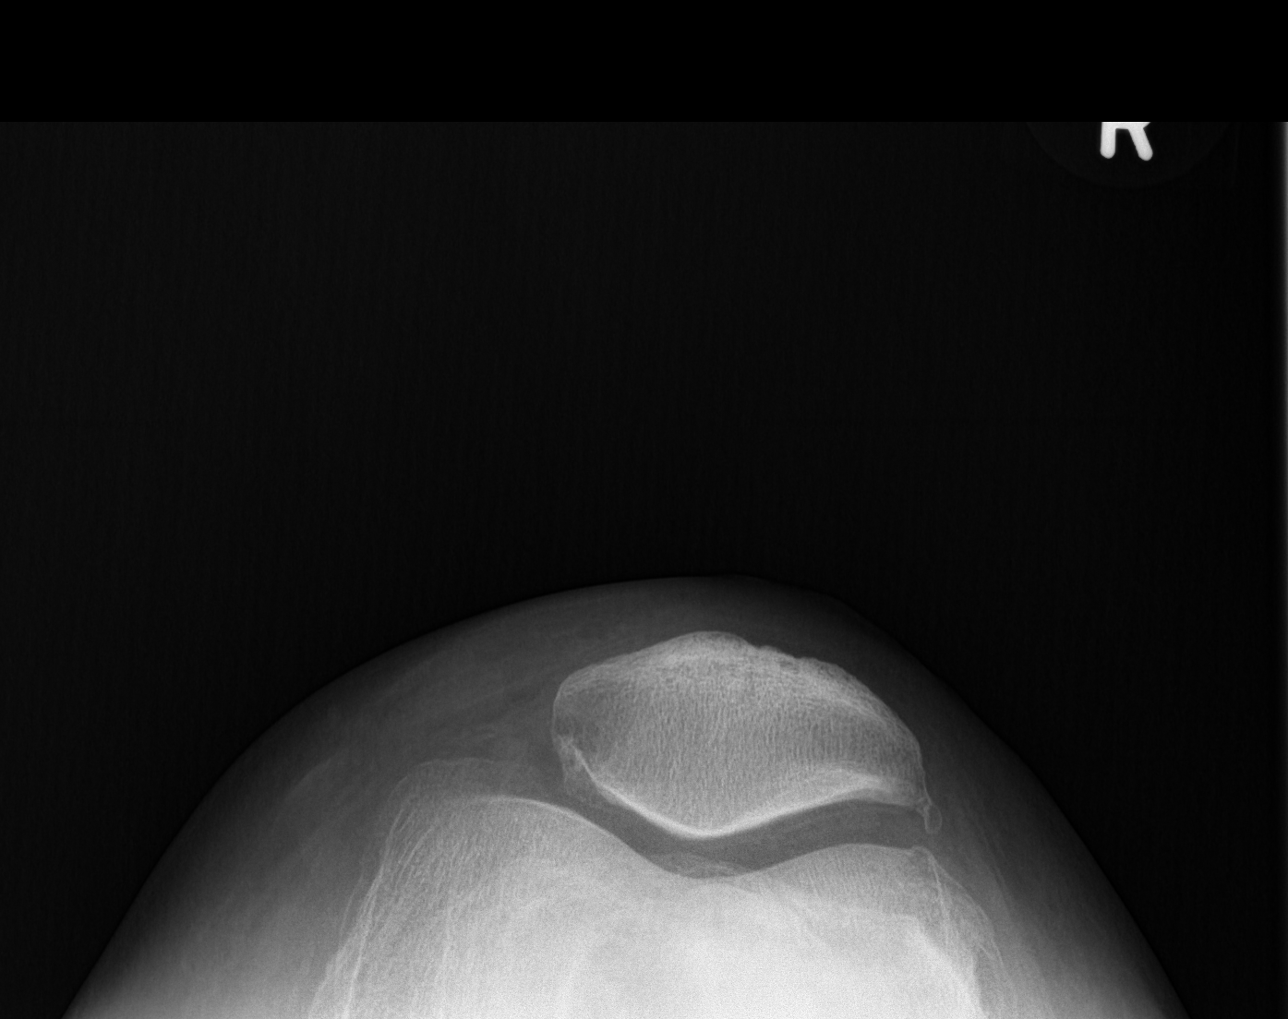
[im 4/4]
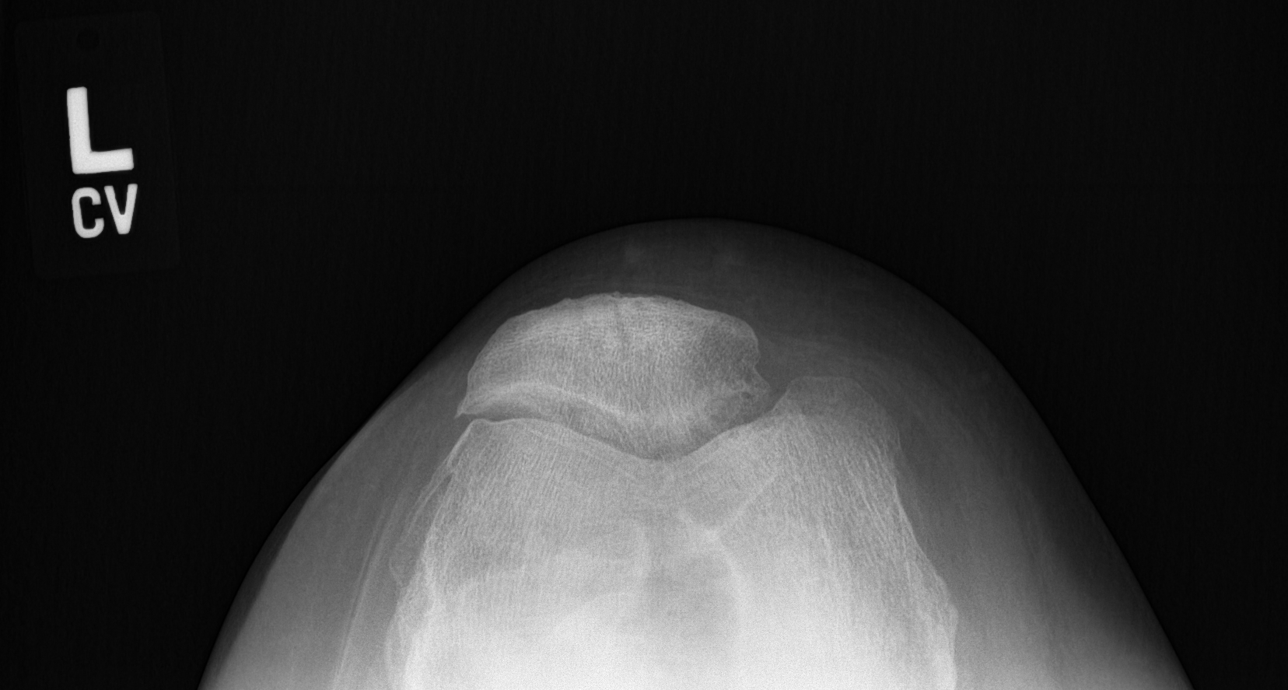

[4 of 4 positions shown; findings below may reference images not displayed]

FINDINGS: Standing AP views of both knees reveal moderate narrowing of the
lateral joint compartment on the right with milder narrowing
medially. On the left there is moderate to severe narrowing of the
medial joint compartment with relative preservation of the lateral
joint compartment. There is beaking of the tibial spines
bilaterally. There is moderate osteoarthritic change of the
patellofemoral joint with prominent osteophytes from the articular
margins of the patella superiorly and inferiorly. There are milder
but similar changes on the right. No effusion is demonstrated.
IMPRESSION: There are tricompartmental osteoarthritic changes of both knees. The
most severe changes involve the medial compartment of the left knee.

## 2015-09-28 ENCOUNTER — Encounter: Payer: Self-pay | Admitting: Cardiovascular Disease

## 2015-10-15 ENCOUNTER — Ambulatory Visit (INDEPENDENT_AMBULATORY_CARE_PROVIDER_SITE_OTHER): Payer: Medicare Other | Admitting: Cardiovascular Disease

## 2015-10-15 ENCOUNTER — Encounter: Payer: Self-pay | Admitting: Cardiovascular Disease

## 2015-10-15 VITALS — BP 120/80 | HR 61 | Ht 71.0 in | Wt 265.8 lb

## 2015-10-15 DIAGNOSIS — I481 Persistent atrial fibrillation: Secondary | ICD-10-CM

## 2015-10-15 DIAGNOSIS — I482 Chronic atrial fibrillation, unspecified: Secondary | ICD-10-CM

## 2015-10-15 DIAGNOSIS — I4819 Other persistent atrial fibrillation: Secondary | ICD-10-CM

## 2015-10-15 NOTE — Patient Instructions (Signed)
Medication Instructions:  Your physician recommends that you continue on your current medications as directed. Please refer to the Current Medication list given to you today.   Labwork: None Ordered   Testing/Procedures: None Ordered   Follow-Up: Your physician wants you to follow-up in: 1 year with Dr. Nahser.  You will receive a reminder letter in the mail two months in advance. If you don't receive a letter, please call our office to schedule the follow-up appointment.   If you need a refill on your cardiac medications before your next appointment, please call your pharmacy.   Thank you for choosing [facility]! Prabhleen Montemayor, RN [phone]    

## 2015-10-15 NOTE — Progress Notes (Signed)
Cardiology Office Note   Date:  10/15/2015   ID:  Louis Fuller, DOB [birth year], MRN [id number]  PCP:  Marcelina Morel, MD  Cardiologist:   Mertie Moores, MD   Chief Complaint  Patient presents with  . Atrial Fibrillation   1. Paroxysmal atrial fibrillation 2. Hypertension 3. Hyperlipidemia 4. Knee arthritis - needs knee replacement  History of Present Illness:  Louis Fuller is a 70 y.o. gentleman with a Hx of paroxysmal Atrial Fibrillation. He notices it significant heart irregularities after he comes in from own grass or doing other yard work. He takes an extra half of of Toprol that seemed to help. He gets some extra exercise  March 04, 2012:  Louis Fuller is doing well. He has some rare episodes of palpitations that clinically sound like atrial fib. The episode lasted most of the night. He takes propranolol on occasion for these episodes of A-Fib.  July 05, 2013:  He has moved to [place] since I last saw him. He needs to have knee replacements.  He has vericose veins in his legs and has leg swelling. No CP, no dyspnea.  He is in A-fib today at his office visit.   August 03, 2013:  He is still in atrial fib. He is basically asymptomatic. He's able to do all of his normal activities without any problems. He denies any chest pain or shortness of breath. He has not had his myoview yet.  Echo revealed. Normal left ventricular systolic function and mild mitral regurgitation, trivial aortic insufficiency.   August 23, 2013: Louis Fuller is seen back - he is still A. symptomatically and basically cannot tell that is in atrial fibrillation. He remains in atrial fibrillation. He's able to do all his normal activities without any issues. He's tolerating the Flecainide well. He's on Eliquis and has not noted any bleeding.  Sept. 1, 2015: Louis Fuller is seen - doing well with current meds.  Ready to have a cardioversion scheduled.  Nov. 3, 2015: Louis Fuller had a successful cardioversion  about a month or so ago. Several days after that he felt that his heart was beating correctly. He is seen today for followup and in fact is back in atrial fibrillation. He continues to be asymptomatic.    Jun 15, 2014: Louis Fuller is a 69 y.o. male who presents for follow-up of his atrial fibrillation. He has been seen by Dr. Rayann Heman for consideration for A. fib ablation. Given his lack of symptoms, A. fib ablation is not currently indicated. He declined one to start Tikosyn. His echocardiogram in June, 2015 showed normal left ventricle systolic function.  September 14, 2014:  Doing well.  No symptoms  Limited by knee pain .   Feb. 14, 2017: Doing well from a cardiac standpoint.   Lots of other issues  - was found to have a precancerous lesion on his colon.  Needs to have 4 teeth removed. Will hold his Eliquis for 2 days prior.   Wife, [name] had a mass removed from her head.   Might be an infection .   Sept. 18, 2017:  Doing well.   HR is well controlled.     All other issues are well controlled.   Has colon polyps ,  Gets colonoscopy yearly .      Past Medical History:  Diagnosis Date  . Arthritis   . Colon polyp   . ED (erectile dysfunction)   . Excessive drinking of alcohol   . Gastritis   . GERD (  gastroesophageal reflux disease)   . Hypercholesterolemia   . 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 Longstanding persistent atrial fibrillation ([place])   . Mitral regurgitation   . Obesity   . Peripheral vascular disease ([place])   . Varicose veins     Past Surgical History:  Procedure Laterality Date  . CARDIOVERSION N/A 10/06/2013   Procedure: CARDIOVERSION;  Surgeon: Lelon Perla, MD;  Location: [facility] ENDOSCOPY;  Service: Cardiovascular;  Laterality: N/A;  . CATARACT EXTRACTION     left  . CATARACT EXTRACTION    . COLONOSCOPY    . COLONOSCOPY WITH PROPOFOL N/A 12/26/2014   Procedure: COLONOSCOPY WITH PROPOFOL;  Surgeon: Lollie Sails,  MD;  Location: [facility] ENDOSCOPY;  Service: Endoscopy;  Laterality: N/A;  . COLONOSCOPY WITH PROPOFOL N/A 03/27/2015   Procedure: COLONOSCOPY WITH PROPOFOL;  Surgeon: Lollie Sails, MD;  Location: [facility] ENDOSCOPY;  Service: Endoscopy;  Laterality: N/A;  . ENDOVENOUS ABLATION SAPHENOUS VEIN W/ LASER    . ESOPHAGOGASTRODUODENOSCOPY (EGD) WITH PROPOFOL N/A 12/26/2014   Procedure: ESOPHAGOGASTRODUODENOSCOPY (EGD) WITH PROPOFOL;  Surgeon: Lollie Sails, MD;  Location: [facility] ENDOSCOPY;  Service: Endoscopy;  Laterality: N/A;  . EYE SURGERY    . vein laser surgery       Current Outpatient Prescriptions  Medication Sig Dispense Refill  . apixaban (ELIQUIS) 5 MG TABS tablet Take 1 tablet (5 mg total) by mouth 2 (two) times daily. 180 tablet 3  . atorvastatin (LIPITOR) 40 MG tablet Take 1 tablet (40 mg total) by mouth daily. 90 tablet 3  . Carboxymethylcellul-Glycerin (REFRESH OPTIVE OP) Place 1 drop into both eyes daily as needed (for itchy eyes).     [name] Kitchen desonide (DESOWEN) 0.05 % ointment Apply 1 application topically daily as needed. Apply to affected areas topically as needed.    . digoxin (LANOXIN) 0.25 MG tablet Take 1 tablet (0.25 mg total) by mouth daily. 90 tablet 3  . metoprolol succinate (TOPROL-XL) 100 MG 24 hr tablet Take 1 tablet (100 mg total) by mouth daily. Take with or immediately following a meal. 90 tablet 3  . Multiple Vitamin (MULTIVITAMIN) tablet Take 1 tablet by mouth daily.      [name] Kitchen omeprazole (PRILOSEC) 20 MG capsule Take 20 mg by mouth daily.      . propranolol (INDERAL) 10 MG tablet Take 1 tablet (10 mg total) by mouth 4 (four) times daily as needed (for heart). 30 tablet 6  . sildenafil (REVATIO) 20 MG tablet Take 20 mg by mouth daily as needed for erectile dysfunction.     No current facility-administered medications for this visit.     Allergies:   Review of patient's allergies indicates no known allergies.    Social History:  The patient  reports that he has never  smoked. He has never used smokeless tobacco. He reports that he drinks about 8.4 oz of alcohol per week . He reports that he does not use drugs.   Family History:  The patient's Family history is unknown by patient.    ROS:  Please see the history of present illness.    Review of Systems: Constitutional:  denies fever, chills, diaphoresis, appetite change and fatigue.  HEENT: denies photophobia, eye pain, redness, hearing loss, ear pain, congestion, sore throat, rhinorrhea, sneezing, neck pain, neck stiffness and tinnitus.  Respiratory: denies SOB, DOE, cough, chest tightness, and wheezing.  Cardiovascular: denies chest pain, palpitations and leg swelling.  Gastrointestinal: denies nausea, vomiting, abdominal pain, diarrhea, constipation,  blood in stool.  Genitourinary: denies dysuria, urgency, frequency, hematuria, flank pain and difficulty urinating.  Musculoskeletal: denies  myalgias, back pain, joint swelling, arthralgias and gait problem.   Skin: denies pallor, rash and wound.  Neurological: denies dizziness, seizures, syncope, weakness, light-headedness, numbness and headaches.   Hematological: denies adenopathy, easy bruising, personal or family bleeding history.  Psychiatric/ Behavioral: denies suicidal ideation, mood changes, confusion, nervousness, sleep disturbance and agitation.       All other systems are reviewed and negative.    PHYSICAL EXAM: VS:  BP 120/80 (BP Location: Left Arm, Patient Position: Sitting, Cuff Size: Large)   Pulse 61   Ht 5\' 11"  (1.803 m)   Wt 265 lb 12.8 oz (120.6 kg)   BMI 37.07 kg/m  , BMI Body mass index is 37.07 kg/m. GEN: Well nourished, well developed, in no acute distress  HEENT: normal  Neck: no JVD, carotid bruits, or masses Cardiac: Irreg. Irreg ; no murmurs, rubs, or gallops, trace  edema  Respiratory:  clear to auscultation bilaterally, normal work of breathing GI: soft, nontender, nondistended, + BS MS: no deformity or atrophy    Skin: warm and dry, no rash Neuro:  Strength and sensation are intact Psych: normal   EKG:  EKG is ordered today. The ekg ordered today demonstrates atrial fibrillation with a rate of 61. HR is slower since previous ECG    Recent Labs: 03/13/2015: ALT 34; BUN 14; Creat 0.93; Potassium 4.5; Sodium 137 03/27/2015: Hemoglobin 14.2; Platelets 200    Lipid Panel    Component Value Date/Time   CHOL 149 03/13/2015 1152   CHOL 153 11/30/2013 0802   TRIG 167 (H) 03/13/2015 1152   HDL 34 (L) 03/13/2015 1152   HDL 57 11/30/2013 0802   CHOLHDL 4.4 03/13/2015 1152   VLDL 33 (H) 03/13/2015 1152   LDLCALC 82 03/13/2015 1152   LDLCALC 75 11/30/2013 0802       Wt Readings from Last 3 Encounters:  10/15/15 265 lb 12.8 oz (120.6 kg)  03/27/15 270 lb (122.5 kg)  03/13/15 267 lb 6.4 oz (121.3 kg)      Other studies Reviewed: Additional studies/ records that were reviewed today include: . Review of the above records demonstrates:    ASSESSMENT AND PLAN:  1. Persistent atrial fibrillation -  Louis Fuller is doing well.  Has chronic atrial fib.  He is completely asymptomatic. Continue current meds.    2. Hypertension - blood pressure is in the low-normal range.  3. Hyperlipidemia - will check fasting lipids at his next office visit .   4. Knee arthritis - needs knee replacement   Current medicines are reviewed at length with the patient today.  The patient does not have concerns regarding medicines.  The following changes have been made:  no change  Labs/ tests ordered today include:   No orders of the defined types were placed in this encounter.    Disposition:   FU with me in 1 year .       Mertie Moores, MD  10/15/2015 10:46 AM    [address], [place], [place]  [address] Phone: [phone]; Fax: [phone]   [facility]  [address] [place], [place]  [address] [phone]    Fax [phone]

## 2016-03-13 ENCOUNTER — Other Ambulatory Visit: Payer: Self-pay | Admitting: Orthopedic Surgery

## 2016-03-13 ENCOUNTER — Ambulatory Visit
Admission: RE | Admit: 2016-03-13 | Discharge: 2016-03-13 | Disposition: A | Discharge status: Home / Self Care | Payer: Medicare Other | Source: Ambulatory Visit | Attending: Orthopedic Surgery | Admitting: Orthopedic Surgery

## 2016-03-13 DIAGNOSIS — M25561 Pain in right knee: Secondary | ICD-10-CM | POA: Diagnosis present

## 2016-03-13 DIAGNOSIS — M25562 Pain in left knee: Principal | ICD-10-CM

## 2016-03-13 DIAGNOSIS — R52 Pain, unspecified: Secondary | ICD-10-CM

## 2016-03-13 DIAGNOSIS — M170 Bilateral primary osteoarthritis of knee: Secondary | ICD-10-CM | POA: Insufficient documentation

## 2016-03-17 ENCOUNTER — Other Ambulatory Visit: Payer: Self-pay | Admitting: Cardiovascular Disease

## 2016-03-18 NOTE — Telephone Encounter (Signed)
Age 71 years Wt 120.6kg seen 10/15/2015 Saw Dr Acie Fredrickson 10/15/2015  03/27/2015 Hgb 14.2 HCT 41.8 03/13/2015 SrCr  0.93 Refill of Eliquis 5mg  bid done

## 2016-04-19 ENCOUNTER — Other Ambulatory Visit: Payer: Self-pay | Admitting: Cardiovascular Disease

## 2016-05-03 ENCOUNTER — Other Ambulatory Visit: Payer: Self-pay | Admitting: Cardiovascular Disease

## 2016-09-03 ENCOUNTER — Other Ambulatory Visit (INDEPENDENT_AMBULATORY_CARE_PROVIDER_SITE_OTHER): Payer: Self-pay | Admitting: Vascular Surgery

## 2016-09-03 DIAGNOSIS — I890 Lymphedema, not elsewhere classified: Secondary | ICD-10-CM

## 2016-09-04 ENCOUNTER — Encounter (INDEPENDENT_AMBULATORY_CARE_PROVIDER_SITE_OTHER): Payer: Self-pay | Admitting: Vascular Surgery

## 2016-09-04 ENCOUNTER — Ambulatory Visit (INDEPENDENT_AMBULATORY_CARE_PROVIDER_SITE_OTHER): Payer: Medicare Other

## 2016-09-04 ENCOUNTER — Ambulatory Visit (INDEPENDENT_AMBULATORY_CARE_PROVIDER_SITE_OTHER): Payer: Medicare Other | Admitting: Vascular Surgery

## 2016-09-04 VITALS — BP 110/73 | HR 61 | Resp 16 | Ht 71.0 in | Wt 260.0 lb

## 2016-09-04 DIAGNOSIS — E7800 Pure hypercholesterolemia, unspecified: Secondary | ICD-10-CM | POA: Diagnosis not present

## 2016-09-04 DIAGNOSIS — I890 Lymphedema, not elsewhere classified: Secondary | ICD-10-CM | POA: Diagnosis not present

## 2016-09-04 DIAGNOSIS — I481 Persistent atrial fibrillation: Secondary | ICD-10-CM | POA: Diagnosis not present

## 2016-09-04 DIAGNOSIS — I83819 Varicose veins of unspecified lower extremities with pain: Secondary | ICD-10-CM

## 2016-09-04 DIAGNOSIS — I4819 Other persistent atrial fibrillation: Secondary | ICD-10-CM

## 2016-09-04 DIAGNOSIS — K219 Gastro-esophageal reflux disease without esophagitis: Secondary | ICD-10-CM

## 2016-09-07 NOTE — Progress Notes (Signed)
MRN : [id number]  Louis Fuller is a 71 y.o. (02-10-1945) male who presents with chief complaint of  Chief Complaint  Patient presents with  . Follow-up    Bilateral Venous DVT duplex  .  History of Present Illness: The patient returns for followup evaluation 3 months after the initial visit. The patient continues to have pain in the lower extremities with dependency. The pain is lessened with elevation. Graduated compression stockings, Class I (20-30 mmHg), have been worn but the stockings do not eliminate the leg pain. Over-the-counter analgesics do not improve the symptoms. The degree of discomfort continues to interfere with daily activities. The patient notes the pain in the legs is causing problems with daily exercise, at the workplace and even with household activities and maintenance such as standing in the kitchen preparing meals and doing dishes.   Venous ultrasound shows normal deep venous system, no evidence of acute or chronic DVT.  Superficial reflux is present in the branches of the saphenous vein.  The sapehenous veins have been successfully ablated bilaterally Current Meds  Medication Sig  . atorvastatin (LIPITOR) 40 MG tablet Take 1 tablet (40 mg total) by mouth daily.  . Carboxymethylcellul-Glycerin (REFRESH OPTIVE OP) Place 1 drop into both eyes daily as needed (for itchy eyes).   [name] Kitchen desonide (DESOWEN) 0.05 % ointment Apply 1 application topically daily as needed. Apply to affected areas topically as needed.  . digoxin (LANOXIN) 0.25 MG tablet TAKE 1 TABLET DAILY  . ELIQUIS 5 MG TABS tablet TAKE 1 TABLET TWICE A DAY  . metoprolol succinate (TOPROL-XL) 100 MG 24 hr tablet Take 1 tablet (100 mg total) by mouth daily. Take with or immediately following a meal.  . metroNIDAZOLE (METROGEL) 0.75 % gel APPLY TO AFFECTED FACE TWICE DAILY  . Multiple Vitamin (MULTIVITAMIN) tablet Take 1 tablet by mouth daily.    [name] Kitchen omeprazole (PRILOSEC) 20 MG capsule Take 20 mg by mouth daily.     . sildenafil (REVATIO) 20 MG tablet Take 20 mg by mouth daily as needed for erectile dysfunction.  . [DISCONTINUED] propranolol (INDERAL) 10 MG tablet Take 1 tablet (10 mg total) by mouth 4 (four) times daily as needed (for heart).    Past Medical History:  Diagnosis Date  . Arthritis   . Colon polyp   . ED (erectile dysfunction)   . Excessive drinking of alcohol   . Gastritis   . GERD (gastroesophageal reflux disease)   . Hypercholesterolemia   . 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 Longstanding persistent atrial fibrillation ([place])   . Mitral regurgitation   . Obesity   . Peripheral vascular disease ([place])   . Varicose veins     Past Surgical History:  Procedure Laterality Date  . CARDIOVERSION N/A 10/06/2013   Procedure: CARDIOVERSION;  Surgeon: Lelon Perla, MD;  Location: [facility] ENDOSCOPY;  Service: Cardiovascular;  Laterality: N/A;  . CATARACT EXTRACTION     left  . CATARACT EXTRACTION    . COLONOSCOPY    . COLONOSCOPY WITH PROPOFOL N/A 12/26/2014   Procedure: COLONOSCOPY WITH PROPOFOL;  Surgeon: Lollie Sails, MD;  Location: [facility] ENDOSCOPY;  Service: Endoscopy;  Laterality: N/A;  . COLONOSCOPY WITH PROPOFOL N/A 03/27/2015   Procedure: COLONOSCOPY WITH PROPOFOL;  Surgeon: Lollie Sails, MD;  Location: [facility] ENDOSCOPY;  Service: Endoscopy;  Laterality: N/A;  . ENDOVENOUS ABLATION SAPHENOUS VEIN W/ LASER    . ESOPHAGOGASTRODUODENOSCOPY (EGD) WITH PROPOFOL N/A 12/26/2014   Procedure:  ESOPHAGOGASTRODUODENOSCOPY (EGD) WITH PROPOFOL;  Surgeon: Lollie Sails, MD;  Location: [facility] ENDOSCOPY;  Service: Endoscopy;  Laterality: N/A;  . EYE SURGERY    . vein laser surgery      Social History Social History  Substance Use Topics  . Smoking status: Never Smoker  . Smokeless tobacco: Never Used  . Alcohol use 8.4 oz/week    14 Cans of beer per week    Family History Family History  Problem Relation Age of Onset  . Family history unknown:  Yes    No Known Allergies   REVIEW OF SYSTEMS (Negative unless checked)  Constitutional: [] Weight loss  [] Fever  [] Chills Cardiac: [] Chest pain   [] Chest pressure   [] Palpitations   [] Shortness of breath when laying flat   [] Shortness of breath with exertion. Vascular:  [] Pain in legs with walking   [x] Pain in legs  [] History of DVT   [] Phlebitis   [x] Swelling in legs   [x] Varicose veins   [] Non-healing ulcers Pulmonary:   [] Uses home oxygen   [] Productive cough   [] Hemoptysis   [] Wheeze  [] COPD   [] Asthma Neurologic:  [] Dizziness   [] Seizures   [] History of stroke   [] History of TIA  [] Aphasia   [] Vissual changes   [] Weakness or numbness in arm   [] Weakness or numbness in leg Musculoskeletal:   [] Joint swelling   [] Joint pain   [] Low back pain Hematologic:  [] Easy bruising  [] Easy bleeding   [] Hypercoagulable state   [] Anemic Gastrointestinal:  [] Diarrhea   [] Vomiting  [] Gastroesophageal reflux/heartburn   [] Difficulty swallowing. Genitourinary:  [] Chronic kidney disease   [] Difficult urination  [] Frequent urination   [] Blood in urine Skin:  [x] Rashes   [] Ulcers  Psychological:  [] History of anxiety   []  History of major depression.  Physical Examination  Vitals:   09/04/16 1418  BP: 110/73  Pulse: 61  Resp: 16  Weight: 260 lb (117.9 kg)  Height: 5\' 11"  (1.803 m)   Body mass index is 36.26 kg/m. Gen: WD/WN, NAD Head: [place]/AT, No temporalis wasting.  Ear/Nose/Throat: Hearing grossly intact, nares w/o erythema or drainage Eyes: PER, EOMI, sclera nonicteric.  Neck: Supple, no large masses.   Pulmonary:  Good air movement, no audible wheezing bilaterally, no use of accessory muscles.  Cardiac: RRR, no JVD Vascular: Large varicosities present extensively greater than 10 mm bilaterally.  Mild venous stasis changes to the legs bilaterally.  2+ soft pitting edema Vessel Right Left  Radial Palpable Palpable  Ulnar Palpable Palpable  Brachial Palpable Palpable  Carotid Palpable  Palpable  Femoral Palpable Palpable  Popliteal Palpable Palpable  PT Palpable Palpable  DP Palpable Palpable  Gastrointestinal: Non-distended. No guarding/no peritoneal signs.  Musculoskeletal: M/S 5/5 throughout.  No deformity or atrophy.  Neurologic: CN 2-12 intact. Symmetrical.  Speech is fluent. Motor exam as listed above. Psychiatric: Judgment intact, Mood & affect appropriate for pt's clinical situation. Dermatologic: venous rashes no ulcers noted.  No changes consistent with cellulitis. Lymph : No lichenification or skin changes of chronic lymphedema.  CBC Lab Results  Component Value Date   WBC 8.6 03/27/2015   HGB 14.2 03/27/2015   HCT 41.8 03/27/2015   MCV 92.9 03/27/2015   PLT 200 03/27/2015    BMET    Component Value Date/Time   NA 137 03/13/2015 1152   NA 140 11/30/2013 0802   K 4.5 03/13/2015 1152   CL 103 03/13/2015 1152   CO2 27 03/13/2015 1152   GLUCOSE 94 03/13/2015 1152   BUN 14  03/13/2015 1152   BUN 15 11/30/2013 0802   CREATININE 0.93 03/13/2015 1152   CALCIUM 9.5 03/13/2015 1152   GFRNONAA 83 11/30/2013 0802   GFRAA 95 11/30/2013 0802   CrCl cannot be calculated (Patient's most recent lab result is older than the maximum 21 days allowed.).  COAG Lab Results  Component Value Date   INR 1.03 03/27/2015   INR 1.0 09/27/2013    Radiology No results found.  Assessment/Plan 1. Varicose veins with pain Recommend:  The patient has had successful ablation of the previously incompetent saphenous venous system but still has persistent symptoms of pain and swelling that are having a negative impact on daily life and daily activities.  Patient should undergo ultrasound guided foam injection sclerotherapy to treat the residual varicosities.  The risks, benefits and alternative therapies were reviewed in detail with the patient.  All questions were answered.  The patient agrees to proceed with sclerotherapy at their convenience.  The patient will  continue wearing the graduated compression stockings and using the over-the-counter pain medications to treat her symptoms.    2. Gastroesophageal reflux disease without esophagitis Continue PPI as already ordered, these medications have been reviewed and there are no changes at this time.   3. Atrial fibrillation, persistent ([place]) Continue antiarrhythmia medications as already ordered, these medications have been reviewed and there are no changes at this time.  Continue anticoagulation as ordered by Cardiology Service   4. Hypercholesterolemia Continue statin as ordered and reviewed, no changes at this time     Hortencia Pilar, MD  09/07/2016 12:52 PM

## 2016-09-09 ENCOUNTER — Ambulatory Visit: Admit: 2016-09-09 | Payer: 59 | Admitting: Gastroenterology

## 2016-09-09 SURGERY — COLONOSCOPY WITH PROPOFOL
Anesthesia: General

## 2016-09-14 ENCOUNTER — Other Ambulatory Visit: Payer: Self-pay | Admitting: Cardiovascular Disease

## 2016-10-07 ENCOUNTER — Encounter: Payer: Self-pay | Admitting: Cardiovascular Disease

## 2016-10-07 ENCOUNTER — Ambulatory Visit (INDEPENDENT_AMBULATORY_CARE_PROVIDER_SITE_OTHER): Payer: Medicare Other | Admitting: Cardiovascular Disease

## 2016-10-07 VITALS — BP 122/80 | HR 69 | Ht 71.0 in | Wt 249.6 lb

## 2016-10-07 DIAGNOSIS — I481 Persistent atrial fibrillation: Secondary | ICD-10-CM | POA: Diagnosis not present

## 2016-10-07 DIAGNOSIS — E782 Mixed hyperlipidemia: Secondary | ICD-10-CM

## 2016-10-07 DIAGNOSIS — I4819 Other persistent atrial fibrillation: Secondary | ICD-10-CM

## 2016-10-07 MED ORDER — ATORVASTATIN CALCIUM 40 MG PO TABS: 40.0000 mg | ORAL_TABLET | Freq: Every day | ORAL | 3 refills | Status: DC

## 2016-10-07 MED ORDER — APIXABAN 5 MG PO TABS: 5.0000 mg | ORAL_TABLET | Freq: Two times a day (BID) | ORAL | 3 refills | Status: DC

## 2016-10-07 MED ORDER — DIGOXIN 250 MCG PO TABS: 0.2500 mg | ORAL_TABLET | Freq: Every day | ORAL | 3 refills | Status: DC

## 2016-10-07 NOTE — Patient Instructions (Signed)

## 2016-10-07 NOTE — Progress Notes (Signed)
Cardiology Office Note   Date:  10/07/2016   ID:  [name], DOB 10-19-1945, MRN [id number]  PCP:  Marcelina Morel, MD  Cardiologist:   Mertie Moores, MD   No chief complaint on file.  1. Paroxysmal atrial fibrillation 2. Hypertension 3. Hyperlipidemia 4. Knee arthritis - needs knee replacement  History of Present Illness:  Louis Fuller is a 71 y.o. gentleman with a Hx of paroxysmal Atrial Fibrillation. He notices it significant heart irregularities after he comes in from own grass or doing other yard work. He takes an extra half of of Toprol that seemed to help. He gets some extra exercise  March 04, 2012:  Louis Fuller is doing well. He has some rare episodes of palpitations that clinically sound like atrial fib. The episode lasted most of the night. He takes propranolol on occasion for these episodes of A-Fib.  July 05, 2013:  He has moved to [place] since I last saw him. He needs to have knee replacements.  He has vericose veins in his legs and has leg swelling. No CP, no dyspnea.  He is in A-fib today at his office visit.   August 03, 2013:  He is still in atrial fib. He is basically asymptomatic. He's able to do all of his normal activities without any problems. He denies any chest pain or shortness of breath. He has not had his myoview yet.  Echo revealed. Normal left ventricular systolic function and mild mitral regurgitation, trivial aortic insufficiency.   August 23, 2013: Louis Fuller is seen back - he is still A. symptomatically and basically cannot tell that is in atrial fibrillation. He remains in atrial fibrillation. He's able to do all his normal activities without any issues. He's tolerating the Flecainide well. He's on Eliquis and has not noted any bleeding.  Sept. 1, 2015: Louis Fuller is seen - doing well with current meds.  Ready to have a cardioversion scheduled.  Nov. 3, 2015: Louis Fuller had a successful cardioversion about a month or so ago. Several  days after that he felt that his heart was beating correctly. He is seen today for followup and in fact is back in atrial fibrillation. He continues to be asymptomatic.    Jun 15, 2014: Louis Fuller is a 70 y.o. male who presents for follow-up of his atrial fibrillation. He has been seen by Dr. Rayann Heman for consideration for A. fib ablation. Given his lack of symptoms, A. fib ablation is not currently indicated. He declined one to start Tikosyn. His echocardiogram in June, 2015 showed normal left ventricle systolic function.  September 14, 2014:  Doing well.  No symptoms  Limited by knee pain .   Feb. 14, 2017: Doing well from a cardiac standpoint.   Lots of other issues  - was found to have a precancerous lesion on his colon.  Needs to have 4 teeth removed. Will hold his Eliquis for 2 days prior.   Wife, Louis Fuller had a mass removed from her head.   Might be an infection .   Sept. 18, 2017:  Doing well.   HR is well controlled.     All other issues are well controlled.   Has colon polyps ,  Gets colonoscopy yearly .   Sept. 11, 2018:  Wt is 95 today  Louis Fuller is seen for follow up of his chronic Afib. Has mild LV dysfunction by echo in May 2016, mild MR  Has been doing ok Is having a colonocsopy Friday  Will take the  last dose of Eliquis on Wednesday .   Past Medical History:  Diagnosis Date  . Arthritis   . Colon polyp   . ED (erectile dysfunction)   . Excessive drinking of alcohol   . Gastritis   . GERD (gastroesophageal reflux disease)   . Hypercholesterolemia   . 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 Longstanding persistent atrial fibrillation ([place])   . Mitral regurgitation   . Obesity   . Peripheral vascular disease ([place])   . Varicose veins     Past Surgical History:  Procedure Laterality Date  . CARDIOVERSION N/A 10/06/2013   Procedure: CARDIOVERSION;  Surgeon: Lelon Perla, MD;  Location: [facility] ENDOSCOPY;  Service: Cardiovascular;   Laterality: N/A;  . CATARACT EXTRACTION     left  . CATARACT EXTRACTION    . COLONOSCOPY    . COLONOSCOPY WITH PROPOFOL N/A 12/26/2014   Procedure: COLONOSCOPY WITH PROPOFOL;  Surgeon: Lollie Sails, MD;  Location: [facility] ENDOSCOPY;  Service: Endoscopy;  Laterality: N/A;  . COLONOSCOPY WITH PROPOFOL N/A 03/27/2015   Procedure: COLONOSCOPY WITH PROPOFOL;  Surgeon: Lollie Sails, MD;  Location: [facility] ENDOSCOPY;  Service: Endoscopy;  Laterality: N/A;  . ENDOVENOUS ABLATION SAPHENOUS VEIN W/ LASER    . ESOPHAGOGASTRODUODENOSCOPY (EGD) WITH PROPOFOL N/A 12/26/2014   Procedure: ESOPHAGOGASTRODUODENOSCOPY (EGD) WITH PROPOFOL;  Surgeon: Lollie Sails, MD;  Location: [facility] ENDOSCOPY;  Service: Endoscopy;  Laterality: N/A;  . EYE SURGERY    . vein laser surgery       Current Outpatient Prescriptions  Medication Sig Dispense Refill  . atorvastatin (LIPITOR) 40 MG tablet TAKE 1 TABLET DAILY 30 tablet 0  . Carboxymethylcellul-Glycerin (REFRESH OPTIVE OP) Place 1 drop into both eyes daily as needed (for itchy eyes).     [name] Kitchen desonide (DESOWEN) 0.05 % ointment Apply 1 application topically daily as needed. Apply to affected areas topically as needed.    . digoxin (LANOXIN) 0.25 MG tablet TAKE 1 TABLET DAILY 90 tablet 1  . ELIQUIS 5 MG TABS tablet TAKE 1 TABLET TWICE A DAY 30 tablet 0  . metoprolol succinate (TOPROL-XL) 100 MG 24 hr tablet Take 1 tablet (100 mg total) by mouth daily. Take with or immediately following a meal. 90 tablet 1  . metroNIDAZOLE (METROGEL) 0.75 % gel APPLY TO AFFECTED FACE TWICE DAILY  4  . Multiple Vitamin (MULTIVITAMIN) tablet Take 1 tablet by mouth daily.      [name] Kitchen omeprazole (PRILOSEC) 20 MG capsule Take 20 mg by mouth daily.      . sildenafil (REVATIO) 20 MG tablet Take 20 mg by mouth daily as needed for erectile dysfunction.     No current facility-administered medications for this visit.     Allergies:   Patient has no known allergies.    Social History:  The  patient  reports that he has never smoked. He has never used smokeless tobacco. He reports that he drinks about 8.4 oz of alcohol per week . He reports that he does not use drugs.   Family History:  The patient's Family history is unknown by patient.    ROS:  Please see the history of present illness.   Review of Systems: Constitutional:  denies fever, chills, diaphoresis, appetite change and fatigue.  HEENT: denies photophobia, eye pain, redness, hearing loss, ear pain, congestion, sore throat, rhinorrhea, sneezing, neck pain, neck stiffness and tinnitus.  Respiratory: denies SOB, DOE, cough, chest tightness, and wheezing.  Cardiovascular: denies chest pain, palpitations  and leg swelling.  Gastrointestinal: denies nausea, vomiting, abdominal pain, diarrhea, constipation, blood in stool.  Genitourinary: denies dysuria, urgency, frequency, hematuria, flank pain and difficulty urinating.  Musculoskeletal: denies  myalgias, back pain, joint swelling, arthralgias and gait problem.   Skin: denies pallor, rash and wound.  Neurological: denies dizziness, seizures, syncope, weakness, light-headedness, numbness and headaches.   Hematological: denies adenopathy, easy bruising, personal or family bleeding history.  Psychiatric/ Behavioral: denies suicidal ideation, mood changes, confusion, nervousness, sleep disturbance and agitation.       All other systems are reviewed and negative.    PHYSICAL EXAM: VS:  There were no vitals taken for this visit. , BMI There is no height or weight on file to calculate BMI. GEN: Well nourished, well developed, in no acute distress  HEENT: normal  Neck: no JVD, carotid bruits, or masses Cardiac: Irreg. Irreg ; no murmurs, rubs, or gallops, trace  edema  Respiratory:  clear to auscultation bilaterally, normal work of breathing GI: soft, nontender, nondistended, + BS MS: no deformity or atrophy  Skin: warm and dry, no rash Neuro:  Strength and sensation are  intact Psych: normal   EKG:  EKG is ordered today. Sept. 11, 2018:   Atrial fib with HR of 69.     Recent Labs: No results found for requested labs within last 8760 hours.    Lipid Panel    Component Value Date/Time   CHOL 149 03/13/2015 1152   CHOL 153 11/30/2013 0802   TRIG 167 (H) 03/13/2015 1152   HDL 34 (L) 03/13/2015 1152   HDL 57 11/30/2013 0802   CHOLHDL 4.4 03/13/2015 1152   VLDL 33 (H) 03/13/2015 1152   LDLCALC 82 03/13/2015 1152   LDLCALC 75 11/30/2013 0802       Wt Readings from Last 3 Encounters:  09/04/16 260 lb (117.9 kg)  10/15/15 265 lb 12.8 oz (120.6 kg)  03/27/15 270 lb (122.5 kg)      Other studies Reviewed: Additional studies/ records that were reviewed today include: . Review of the above records demonstrates:    ASSESSMENT AND PLAN:  1. Persistent atrial fibrillation -  Louis Fuller is doing well.  Has chronic atrial fib.  He is completely asymptomatic. Continue current meds.    2. Hypertension - BP is now stable   3. Hyperlipidemia -   4. Knee arthritis - needs knee replacement   Current medicines are reviewed at length with the patient today.  The patient does not have concerns regarding medicines.  The following changes have been made:  no change  Labs/ tests ordered today include:   No orders of the defined types were placed in this encounter.  Disposition:   FU with me in 1 year .       Mertie Moores, MD  10/07/2016 5:28 AM    [facility] [address], [place], [place]  [address] Phone: [phone]; Fax: [phone]

## 2016-10-08 LAB — HEPATIC FUNCTION PANEL
ALBUMIN: 4.6 g/dL (ref 3.6–4.8)
ALK PHOS: 99 IU/L (ref 39–117)
ALT: 33 IU/L (ref 0–44)
AST: 22 IU/L (ref 0–40)
BILIRUBIN, DIRECT: 0.24 mg/dL (ref 0.00–0.40)
Bilirubin Total: 0.9 mg/dL (ref 0.0–1.2)
Total Protein: 7 g/dL (ref 6.0–8.5)

## 2016-10-08 LAB — LIPID PANEL
CHOLESTEROL TOTAL: 157 mg/dL (ref 100–199)
Chol/HDL Ratio: 3.7 ratio (ref 0.0–5.0)
HDL: 42 mg/dL (ref >39)
LDL Calculated: 76 mg/dL (ref 0–99)
Triglycerides: 197 mg/dL — ABNORMAL HIGH (ref 0–149)
VLDL CHOLESTEROL CAL: 39 mg/dL (ref 5–40)

## 2016-10-08 LAB — BASIC METABOLIC PANEL
BUN / CREAT RATIO: 16 (ref 10–24)
BUN: 13 mg/dL (ref 8–27)
CO2: 22 mmol/L (ref 20–29)
Calcium: 9.4 mg/dL (ref 8.6–10.2)
Chloride: 99 mmol/L (ref 96–106)
Creatinine, Ser: 0.83 mg/dL (ref 0.76–1.27)
GFR calc Af Amer: 105 mL/min/{1.73_m2} (ref >59)
GFR, EST NON AFRICAN AMERICAN: 90 mL/min/{1.73_m2} (ref >59)
Glucose: 98 mg/dL (ref 65–99)
Potassium: 4.7 mmol/L (ref 3.5–5.2)
SODIUM: 141 mmol/L (ref 134–144)

## 2016-10-14 ENCOUNTER — Telehealth: Payer: Self-pay | Admitting: Cardiovascular Disease

## 2016-10-14 NOTE — Telephone Encounter (Signed)
Pharmacy is calling because they are switching to a different manufacture for patient's digoxin. Pharmacy wants to get the okay for this switch by the doctor. Will forward to Dr. Acie Fredrickson.

## 2016-10-14 NOTE — Telephone Encounter (Signed)
New message    [facility] asking for directions on pt Digoxin. Please call.

## 2016-10-14 NOTE — Telephone Encounter (Signed)
[facility] called to follow up on approval for manufacture change on patients digoxin. I have made pharmacist, Romelle Starcher aware that per Dr Acie Fredrickson this is okay. They will get this processed and shipped to the patient.

## 2016-10-14 NOTE — Telephone Encounter (Signed)
This will be fine.

## 2016-10-20 ENCOUNTER — Ambulatory Visit (INDEPENDENT_AMBULATORY_CARE_PROVIDER_SITE_OTHER): Payer: Medicare Other | Admitting: Vascular Surgery

## 2016-10-20 ENCOUNTER — Encounter (INDEPENDENT_AMBULATORY_CARE_PROVIDER_SITE_OTHER): Payer: Self-pay | Admitting: Vascular Surgery

## 2016-10-20 VITALS — BP 113/71 | HR 66 | Resp 17 | Wt 254.0 lb

## 2016-10-20 DIAGNOSIS — I83812 Varicose veins of left lower extremities with pain: Secondary | ICD-10-CM

## 2016-10-20 DIAGNOSIS — I83819 Varicose veins of unspecified lower extremities with pain: Secondary | ICD-10-CM

## 2016-10-20 NOTE — Progress Notes (Signed)
  Louis Fuller is a 71 y.o.male who presents with painful varicose veins of the left leg  Past Medical History:  Diagnosis Date  . Arthritis   . Colon polyp   . ED (erectile dysfunction)   . Excessive drinking of alcohol   . Gastritis   . GERD (gastroesophageal reflux disease)   . Hypercholesterolemia   . 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 Longstanding persistent atrial fibrillation ([place])   . Mitral regurgitation   . Obesity   . Peripheral vascular disease ([place])   . Varicose veins     Past Surgical History:  Procedure Laterality Date  . CARDIOVERSION N/A 10/06/2013   Procedure: CARDIOVERSION;  Surgeon: Lelon Perla, MD;  Location: [facility] ENDOSCOPY;  Service: Cardiovascular;  Laterality: N/A;  . CATARACT EXTRACTION     left  . CATARACT EXTRACTION    . COLONOSCOPY    . COLONOSCOPY WITH PROPOFOL N/A 12/26/2014   Procedure: COLONOSCOPY WITH PROPOFOL;  Surgeon: Lollie Sails, MD;  Location: [facility] ENDOSCOPY;  Service: Endoscopy;  Laterality: N/A;  . COLONOSCOPY WITH PROPOFOL N/A 03/27/2015   Procedure: COLONOSCOPY WITH PROPOFOL;  Surgeon: Lollie Sails, MD;  Location: [facility] ENDOSCOPY;  Service: Endoscopy;  Laterality: N/A;  . ENDOVENOUS ABLATION SAPHENOUS VEIN W/ LASER    . ESOPHAGOGASTRODUODENOSCOPY (EGD) WITH PROPOFOL N/A 12/26/2014   Procedure: ESOPHAGOGASTRODUODENOSCOPY (EGD) WITH PROPOFOL;  Surgeon: Lollie Sails, MD;  Location: [facility] ENDOSCOPY;  Service: Endoscopy;  Laterality: N/A;  . EYE SURGERY    . vein laser surgery      Current Outpatient Prescriptions  Medication Sig Dispense Refill  . apixaban (ELIQUIS) 5 MG TABS tablet Take 1 tablet (5 mg total) by mouth 2 (two) times daily. 180 tablet 3  . atorvastatin (LIPITOR) 40 MG tablet Take 1 tablet (40 mg total) by mouth daily. 90 tablet 3  . Carboxymethylcellul-Glycerin (REFRESH OPTIVE OP) Place 1 drop into both eyes daily as needed (for itchy eyes).     [name] Kitchen desonide (DESOWEN) 0.05 %  ointment Apply 1 application topically daily as needed. Apply to affected areas topically as needed.    . digoxin (LANOXIN) 0.25 MG tablet Take 1 tablet (0.25 mg total) by mouth daily. 90 tablet 3  . metoprolol succinate (TOPROL-XL) 100 MG 24 hr tablet Take 1 tablet (100 mg total) by mouth daily. Take with or immediately following a meal. 90 tablet 1  . metroNIDAZOLE (METROGEL) 0.75 % gel APPLY TO AFFECTED FACE TWICE DAILY  4  . Multiple Vitamin (MULTIVITAMIN) tablet Take 1 tablet by mouth daily.      [name] Kitchen omeprazole (PRILOSEC) 20 MG capsule Take 20 mg by mouth daily.      . sildenafil (REVATIO) 20 MG tablet Take 20 mg by mouth daily as needed for erectile dysfunction.     No current facility-administered medications for this visit.     No Known Allergies  Indication: Patient presents with symptomatic varicose veins of the left lower extremity.  Procedure: Foam sclerotherapy was performed on the bilateral lower extremity. Using ultrasound guidance, 15 mL of foam Sotradecol was used to inject the varicosities of the left lower extremity. Compression wraps were placed. The patient tolerated the procedure well.

## 2016-10-22 ENCOUNTER — Ambulatory Visit
Admission: RE | Admit: 2016-10-22 | Discharge: 2016-10-22 | Disposition: A | Discharge status: Home / Self Care | Payer: Medicare Other | Source: Ambulatory Visit | Attending: Internal Medicine | Admitting: Internal Medicine

## 2016-10-22 ENCOUNTER — Encounter
Admission: RE | Disposition: A | Discharge status: Home / Self Care | Payer: Self-pay | Source: Ambulatory Visit | Attending: Internal Medicine

## 2016-10-22 ENCOUNTER — Ambulatory Visit: Payer: Medicare Other | Admitting: Anesthesiology

## 2016-10-22 DIAGNOSIS — K219 Gastro-esophageal reflux disease without esophagitis: Secondary | ICD-10-CM | POA: Diagnosis not present

## 2016-10-22 DIAGNOSIS — Z79899 Other long term (current) drug therapy: Secondary | ICD-10-CM | POA: Diagnosis not present

## 2016-10-22 DIAGNOSIS — Z86010 Personal history of colonic polyps: Secondary | ICD-10-CM | POA: Diagnosis not present

## 2016-10-22 DIAGNOSIS — Z1211 Encounter for screening for malignant neoplasm of colon: Secondary | ICD-10-CM | POA: Insufficient documentation

## 2016-10-22 DIAGNOSIS — K5730 Diverticulosis of large intestine without perforation or abscess without bleeding: Secondary | ICD-10-CM | POA: Diagnosis not present

## 2016-10-22 DIAGNOSIS — K621 Rectal polyp: Secondary | ICD-10-CM | POA: Insufficient documentation

## 2016-10-22 DIAGNOSIS — I481 Persistent atrial fibrillation: Secondary | ICD-10-CM | POA: Insufficient documentation

## 2016-10-22 DIAGNOSIS — I739 Peripheral vascular disease, unspecified: Secondary | ICD-10-CM | POA: Diagnosis not present

## 2016-10-22 DIAGNOSIS — E669 Obesity, unspecified: Secondary | ICD-10-CM | POA: Diagnosis not present

## 2016-10-22 DIAGNOSIS — E7800 Pure hypercholesterolemia, unspecified: Secondary | ICD-10-CM | POA: Insufficient documentation

## 2016-10-22 DIAGNOSIS — K640 First degree hemorrhoids: Secondary | ICD-10-CM | POA: Insufficient documentation

## 2016-10-22 DIAGNOSIS — I340 Nonrheumatic mitral (valve) insufficiency: Secondary | ICD-10-CM | POA: Insufficient documentation

## 2016-10-22 DIAGNOSIS — Z6834 Body mass index (BMI) 34.0-34.9, adult: Secondary | ICD-10-CM | POA: Diagnosis not present

## 2016-10-22 DIAGNOSIS — K5520 Angiodysplasia of colon without hemorrhage: Secondary | ICD-10-CM | POA: Diagnosis not present

## 2016-10-22 DIAGNOSIS — Z7902 Long term (current) use of antithrombotics/antiplatelets: Secondary | ICD-10-CM | POA: Diagnosis not present

## 2016-10-22 HISTORY — DX: Cardiac arrhythmia, unspecified: I49.9

## 2016-10-22 HISTORY — PX: COLONOSCOPY WITH PROPOFOL: SHX5780

## 2016-10-22 SURGERY — COLONOSCOPY WITH PROPOFOL
Anesthesia: General

## 2016-10-22 MED ORDER — FENTANYL CITRATE (PF) 100 MCG/2ML IJ SOLN
INTRAMUSCULAR | Status: DC | PRN
  Administered 2016-10-22: 50 ug via INTRAVENOUS

## 2016-10-22 MED ORDER — SODIUM CHLORIDE 0.9 % IV SOLN
INTRAVENOUS | Status: DC
  Administered 2016-10-22: 10:00:00 via INTRAVENOUS

## 2016-10-22 MED ORDER — MIDAZOLAM HCL 2 MG/2ML IJ SOLN
INTRAMUSCULAR | Status: DC | PRN
  Administered 2016-10-22: 2 mg via INTRAVENOUS

## 2016-10-22 MED ORDER — PROPOFOL 500 MG/50ML IV EMUL
INTRAVENOUS | Status: DC | PRN
  Administered 2016-10-22: 120 ug/kg/min via INTRAVENOUS

## 2016-10-22 MED ORDER — MIDAZOLAM HCL 2 MG/2ML IJ SOLN
INTRAMUSCULAR | Status: AC
  Filled 2016-10-22: qty 2

## 2016-10-22 MED ORDER — EPHEDRINE SULFATE 50 MG/ML IJ SOLN
INTRAMUSCULAR | Status: AC
  Filled 2016-10-22: qty 1

## 2016-10-22 MED ORDER — EPHEDRINE SULFATE 50 MG/ML IJ SOLN
INTRAMUSCULAR | Status: DC | PRN
  Administered 2016-10-22: 10 mg via INTRAVENOUS
  Administered 2016-10-22: 5 mg via INTRAVENOUS

## 2016-10-22 MED ORDER — FENTANYL CITRATE (PF) 100 MCG/2ML IJ SOLN
INTRAMUSCULAR | Status: AC
  Filled 2016-10-22: qty 2

## 2016-10-22 MED ORDER — PROPOFOL 500 MG/50ML IV EMUL
INTRAVENOUS | Status: AC
  Filled 2016-10-22: qty 50

## 2016-10-22 NOTE — Anesthesia Post-op Follow-up Note (Signed)
Anesthesia QCDR form completed.        

## 2016-10-22 NOTE — Anesthesia Postprocedure Evaluation (Signed)
Anesthesia Post Note  Patient: Louis Fuller  Procedure(s) Performed: Procedure(s) (LRB): COLONOSCOPY WITH PROPOFOL (N/A)  Patient location during evaluation: Endoscopy Anesthesia Type: General Level of consciousness: awake and alert Pain management: pain level controlled Vital Signs Assessment: post-procedure vital signs reviewed and stable Respiratory status: spontaneous breathing and respiratory function stable Cardiovascular status: stable Anesthetic complications: no     Last Vitals:  Vitals:   10/22/16 1024 10/22/16 1110  BP: 125/74 113/65  Pulse: 66 84  Resp: 18 12  Temp: (!) 36 C 36.5 C  SpO2: 100% 98%    Last Pain:  Vitals:   10/22/16 1110  TempSrc: Tympanic                 [name],[name]

## 2016-10-22 NOTE — Op Note (Addendum)
[facility] Gastroenterology Patient Name: Louis Fuller Procedure Date: 10/22/2016 10:35 AM MRN: [id number] Account #: [id number] Date of Birth: Sep 05, 1945 Admit Type: Outpatient Age: 71 Room: [facility] ENDO ROOM 1 Gender: Male Note Status: Finalized Procedure:            Colonoscopy Indications:          High risk colon cancer surveillance: Personal history                        of adenoma (10 mm or greater in size), High risk colon                        cancer surveillance: Personal history of sessile                        serrated colon polyp with dysplasia Providers:            Benay Pike. Alice Reichert MD, MD Referring MD:         Marcelina Morel, MD (Referring MD) Medicines:            Propofol per Anesthesia Complications:        No immediate complications. Procedure:            Pre-Anesthesia Assessment:                       - ASA Grade Assessment: III - A patient with severe                        systemic disease.                       - After reviewing the risks and benefits, the patient                        was deemed in satisfactory condition to undergo the                        procedure.                       After obtaining informed consent, the colonoscope was                        passed under direct vision. Throughout the procedure,                        the patient's blood pressure, pulse, and oxygen                        saturations were monitored continuously. The                        Colonoscope was introduced through the anus and                        advanced to the the cecum, identified by appendiceal                        orifice and ileocecal valve. The colonoscopy was  performed without difficulty. The patient tolerated the                        procedure well. The quality of the bowel preparation                        was excellent. The ileocecal valve, appendiceal                        orifice, and  rectum were photographed. The entire colon                        was well visualized. Findings:      A few small-mouthed diverticula were found in the sigmoid colon.      Non-bleeding internal hemorrhoids were found during retroflexion. The       hemorrhoids were Grade I (internal hemorrhoids that do not prolapse).      The perianal and digital rectal examinations were normal.      A 4 mm polyp was found in the rectum. The polyp was sessile. The polyp       was removed with a hot snare. Resection and retrieval were complete. Impression:           - Diverticulosis in the sigmoid colon.                       - Non-bleeding internal hemorrhoids. Recommendation:       - Discharge patient to home (with spouse).                       - Written discharge instructions were provided to the                        patient.                       - Resume previous diet.                       - Continue present medications.                       - Await pathology results.                       - Post procedure medication instructions were provided                        to the patient.                       - Repeat colonoscopy for surveillance based on                        pathology results.                       - Return to GI clinic PRN.                       - The findings and recommendations were discussed with                        the  patient.                       - The findings and recommendations were discussed with                        the patient's family. Procedure Code(s):    --- Professional ---                       [phone], Colonoscopy, flexible; with removal of tumor(s),                        polyp(s), or other lesion(s) by snare technique CPT copyright 2016 American Medical Association. All rights reserved. The codes documented in this report are preliminary and upon coder review may  be revised to meet current compliance requirements. Efrain Sella MD, MD 10/22/2016  11:17:53 AM This report has been signed electronically. Number of Addenda: 0 Note Initiated On: 10/22/2016 10:35 AM Scope Withdrawal Time: 0 hours 8 minutes 29 seconds  Total Procedure Duration: 0 hours 13 minutes 1 second       [facility]

## 2016-10-22 NOTE — Interval H&P Note (Signed)
History and Physical Interval Note:  10/22/2016 10:53 AM  Louis Fuller  has presented today for surgery, with the diagnosis of HX.OF COLON POLYPS  The various methods of treatment have been discussed with the patient and family. After consideration of risks, benefits and other options for treatment, the patient has consented to  Procedure(s): COLONOSCOPY WITH PROPOFOL (N/A) as a surgical intervention .  The patient's history has been reviewed, patient examined, no change in status, stable for surgery.  I have reviewed the patient's chart and labs.  Questions were answered to the patient's satisfaction.     [place], [place]

## 2016-10-22 NOTE — H&P (Signed)
Outpatient short stay form Pre-procedure 10/22/2016 10:45 AM Louis Fuller K. Alice Reichert, M.D.  Primary Physician: Dr. Dory Larsen   Reason for visit:  Hx of multiple adenomatous polyps  History of present illness:  Patient with personal hx of multiple tubular adenomas and sessile serrated adenoma in 02/2015. Dr. Gustavo Lah. He advised repeat colonoscopy in 1 year. Patient denies abdominal pain, rectal bleeding or weight loss.    Current Facility-Administered Medications:  .  0.9 %  sodium chloride infusion, , Intravenous, Continuous, Lollie Sails, MD, Last Rate: 20 mL/hr at 10/22/16 1023  Prescriptions Prior to Admission  Medication Sig Dispense Refill Last Dose  . atorvastatin (LIPITOR) 40 MG tablet Take 1 tablet (40 mg total) by mouth daily. 90 tablet 3 10/21/2016 at Unknown time  . Carboxymethylcellul-Glycerin (REFRESH OPTIVE OP) Place 1 drop into both eyes daily as needed (for itchy eyes).    10/21/2016 at Unknown time  . desonide (DESOWEN) 0.05 % ointment Apply 1 application topically daily as needed. Apply to affected areas topically as needed.   10/21/2016 at Unknown time  . digoxin (LANOXIN) 0.25 MG tablet Take 1 tablet (0.25 mg total) by mouth daily. 90 tablet 3 10/22/2016 at Unknown time  . metoprolol succinate (TOPROL-XL) 100 MG 24 hr tablet Take 1 tablet (100 mg total) by mouth daily. Take with or immediately following a meal. 90 tablet 1 10/22/2016 at Unknown time  . omeprazole (PRILOSEC) 20 MG capsule Take 20 mg by mouth daily.     10/22/2016 at Unknown time  . apixaban (ELIQUIS) 5 MG TABS tablet Take 1 tablet (5 mg total) by mouth 2 (two) times daily. 180 tablet 3 10/19/2016  . Multiple Vitamin (MULTIVITAMIN) tablet Take 1 tablet by mouth daily.     10/15/2016  . sildenafil (REVATIO) 20 MG tablet Take 20 mg by mouth daily as needed for erectile dysfunction.   Not Taking at Unknown time     No Known Allergies   Past Medical History:  Diagnosis Date  . Arthritis   . Colon polyp    . Dysrhythmia   . ED (erectile dysfunction)   . Excessive drinking of alcohol   . Gastritis   . GERD (gastroesophageal reflux disease)   . Hypercholesterolemia   . 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 Longstanding persistent atrial fibrillation ([place])   . Mitral regurgitation   . Obesity   . Peripheral vascular disease ([place])   . Varicose veins     Review of systems:   NO CP, SOB, sweats. No syncope, stroke or seizure hx/    Physical Exam  Gen: NAD. Appears comfortable.  HEENT: [place]/AT. PERRLA. Normal external ear exam.  Chest: CTA, no wheezes.  CV: RR nl S1, S2. No gallops.  Abd: soft, nt, nd. BS+  Ext: no edema. Pulses 2+  Neuro: Alert and oriented. Judgement appears normal. Nonfocal.    Planned procedures: Proceed with colonoscopy. The patient understands the nature of the planned procedure, indications, risks, alternatives and potential complications including but not limited to bleeding, infection, perforation, damage to internal organs and possible oversedation/side effects from anesthesia. The patient agrees and gives consent to proceed.  Please refer to procedure notes for findings, recommendations and patient disposition/instructions.    June Rode K. Alice Reichert, M.D. Gastroenterology 10/22/2016  10:45 AM

## 2016-10-22 NOTE — Anesthesia Procedure Notes (Signed)
Performed by: COOK-MARTIN, Raylin Winer Pre-anesthesia Checklist: Patient identified, Emergency Drugs available, Suction available, Patient being monitored and Timeout performed Patient Re-evaluated:Patient Re-evaluated prior to induction Oxygen Delivery Method: Nasal cannula Preoxygenation: Pre-oxygenation with 100% oxygen Induction Type: IV induction Placement Confirmation: positive ETCO2 and CO2 detector       

## 2016-10-22 NOTE — Anesthesia Preprocedure Evaluation (Signed)
Anesthesia Evaluation  Patient identified by MRN, date of birth, ID band Patient awake    Reviewed: Allergy & Precautions, NPO status , Patient's Chart, lab work & pertinent test results  History of Anesthesia Complications Negative for: history of anesthetic complications  Airway Mallampati: II       Dental   Pulmonary neg sleep apnea, neg COPD,           Cardiovascular hypertension, Pt. on medications and Pt. on home beta blockers + Peripheral Vascular Disease  + dysrhythmias Atrial Fibrillation      Neuro/Psych neg Seizures    GI/Hepatic Neg liver ROS, GERD  Medicated and Controlled,  Endo/Other  neg diabetes  Renal/GU negative Renal ROS     Musculoskeletal   Abdominal   Peds  Hematology   Anesthesia Other Findings   Reproductive/Obstetrics                             Anesthesia Physical Anesthesia Plan  ASA: III  Anesthesia Plan: General   Post-op Pain Management:    Induction: Intravenous  PONV Risk Score and Plan: Propofol infusion  Airway Management Planned:   Additional Equipment:   Intra-op Plan:   Post-operative Plan:   Informed Consent: I have reviewed the patients History and Physical, chart, labs and discussed the procedure including the risks, benefits and alternatives for the proposed anesthesia with the patient or authorized representative who has indicated his/her understanding and acceptance.     Plan Discussed with:   Anesthesia Plan Comments:         Anesthesia Quick Evaluation

## 2016-10-22 NOTE — Transfer of Care (Signed)
Immediate Anesthesia Transfer of Care Note  Patient: Louis Fuller  Procedure(s) Performed: Procedure(s): COLONOSCOPY WITH PROPOFOL (N/A)  Patient Location: PACU  Anesthesia Type:General  Level of Consciousness: awake and sedated  Airway & Oxygen Therapy: Patient Spontanous Breathing and Patient connected to nasal cannula oxygen  Post-op Assessment: Report given to RN and Post -op Vital signs reviewed and stable  Post vital signs: Reviewed and stable  Last Vitals:  Vitals:   10/22/16 1024  BP: 125/74  Pulse: 66  Resp: 18  Temp: (!) 36 C  SpO2: 100%    Last Pain:  Vitals:   10/22/16 1024  TempSrc: Tympanic         Complications: No apparent anesthesia complications

## 2016-10-23 ENCOUNTER — Encounter: Payer: Self-pay | Admitting: Internal Medicine

## 2016-10-23 LAB — SURGICAL PATHOLOGY

## 2016-11-01 ENCOUNTER — Other Ambulatory Visit: Payer: Self-pay | Admitting: Cardiovascular Disease

## 2016-11-20 ENCOUNTER — Ambulatory Visit (INDEPENDENT_AMBULATORY_CARE_PROVIDER_SITE_OTHER): Payer: Medicare Other | Admitting: Vascular Surgery

## 2016-11-20 ENCOUNTER — Encounter (INDEPENDENT_AMBULATORY_CARE_PROVIDER_SITE_OTHER): Payer: Self-pay | Admitting: Vascular Surgery

## 2016-11-20 VITALS — BP 110/67 | HR 58 | Resp 16 | Ht 71.0 in | Wt 255.0 lb

## 2016-11-20 DIAGNOSIS — I83812 Varicose veins of left lower extremities with pain: Secondary | ICD-10-CM

## 2016-11-20 DIAGNOSIS — I83811 Varicose veins of right lower extremities with pain: Secondary | ICD-10-CM

## 2016-11-20 DIAGNOSIS — I83819 Varicose veins of unspecified lower extremities with pain: Secondary | ICD-10-CM

## 2016-11-20 NOTE — Progress Notes (Signed)
   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  Procedure:  Sclerotherapy using both foam and hypertonic saline mixed with 1% Lidocaine was performed on the bilateral lower extremities.  The injection of the foam was performed under ultrasound guidance.  Target veins were identified with the ultrasound.  They were echolucent and compressible indicating patency.  Access was made under direct ultrasound visualization.  After completing sclerotherapy bilaterally compression wraps were placed.  The patient tolerated the procedure well.  Plan:  Follow up as needed.

## 2017-03-27 IMAGING — CR DG KNEE 3 VIEWS*L*
1 series · 3 of 3 positions shown · non-contrast
Comparison: 05/25/2014.

CLINICAL DATA: Bilateral knee pain for 5-10 years. No known injury.
Left more painful than right.

EXAM:
LEFT KNEE - 3 VIEW

[Series 1: dg knee 3 views left · 0.14mm/px · 3 of 3 slices shown]
[im 1/3]
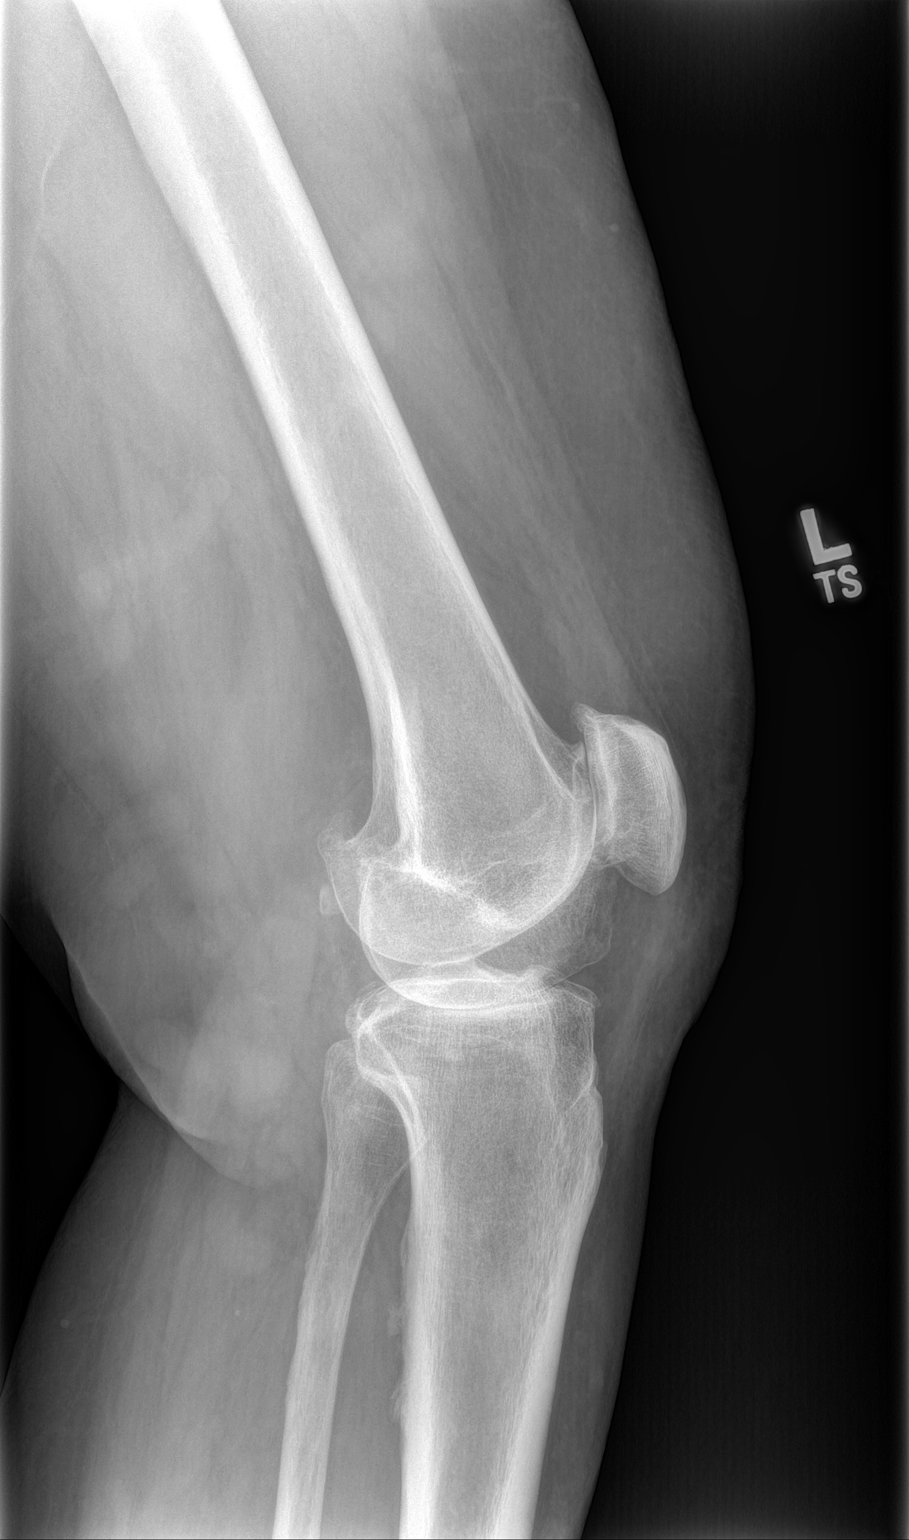
[im 2/3]
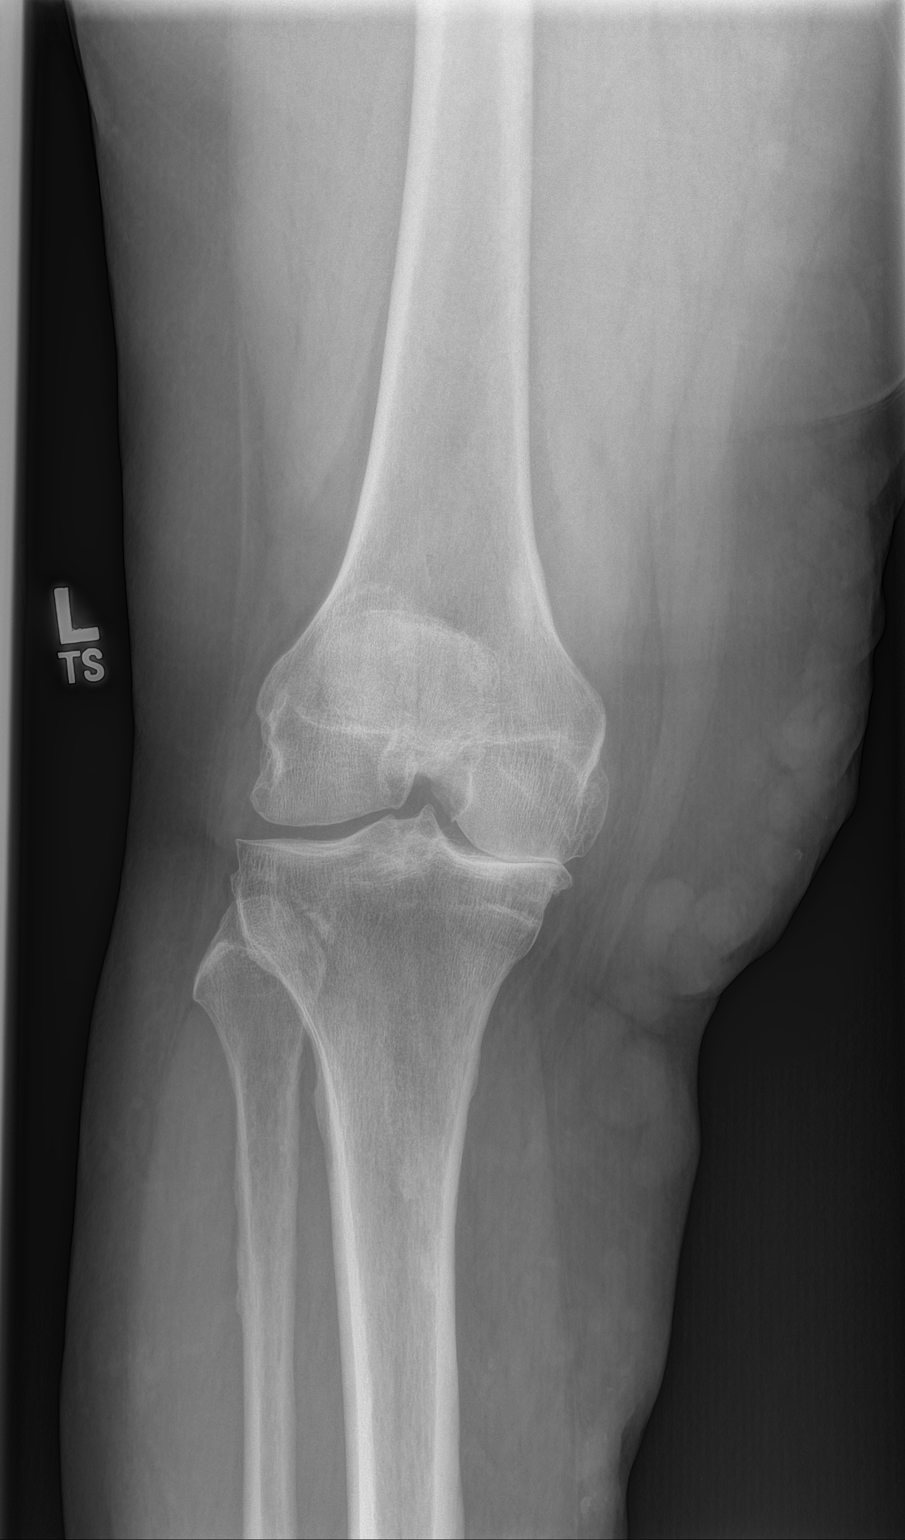
[im 3/3]
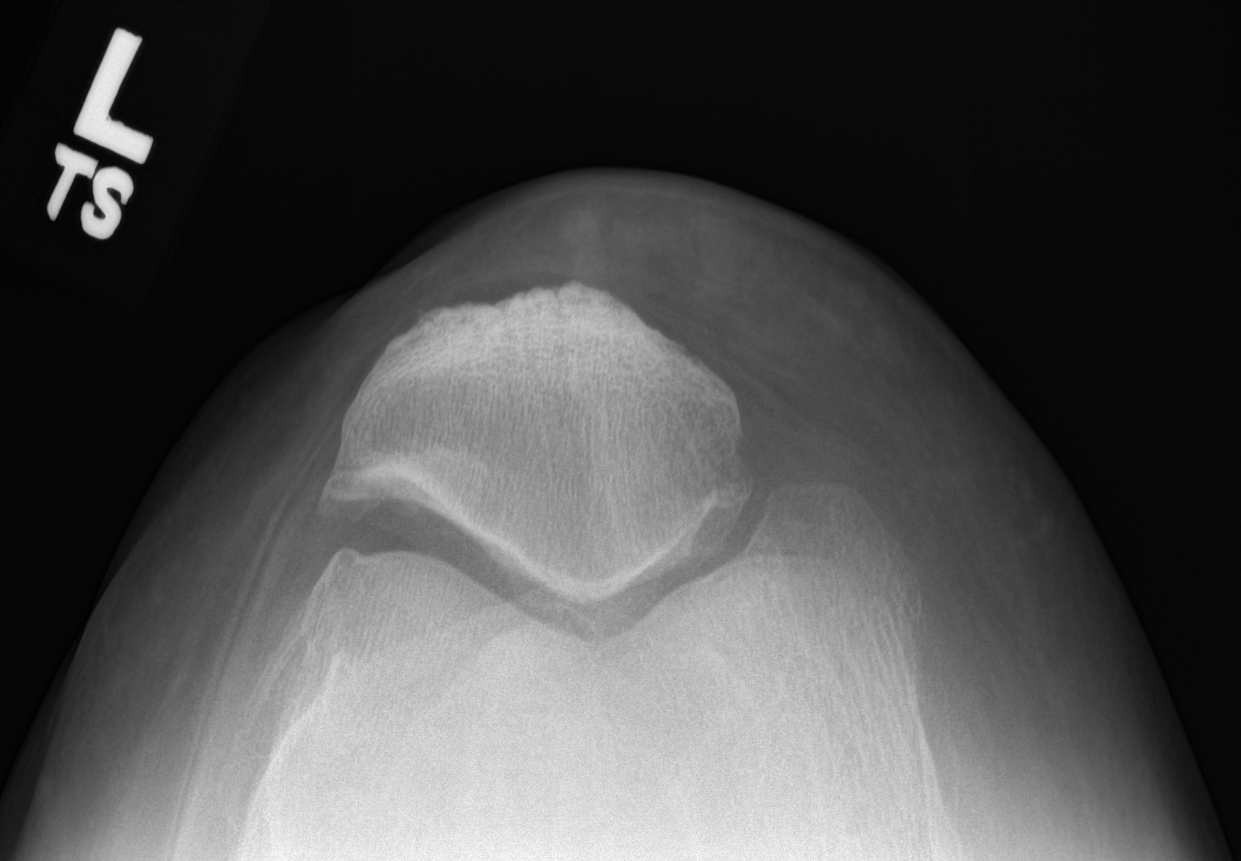

[3 of 3 positions shown; findings below may reference images not displayed]

FINDINGS: No fracture.  No bone lesion.

There is marked narrowing of the medial joint space compartment with
mild subchondral sclerosis as well as marginal osteophytes. Small
marginal osteophytes are noted from the lateral compartment with
more prominent osteophytes projecting from the patella.

No joint effusion.

Surrounding soft tissues are unremarkable.
IMPRESSION: 1. No fracture or acute finding.
2. Advanced osteoarthritis, primarily affecting the medial joint
space compartment. No significant change from the prior exam.

## 2017-04-07 ENCOUNTER — Telehealth: Payer: Self-pay

## 2017-04-07 NOTE — Telephone Encounter (Signed)
**Note De-Identified Merinda Victorino Obfuscation** Received an Eliquis PA request form from [employer] [facility]. I have completed the form and placed it in Dr Lanny Hurst mail bin awaiting his signature.

## 2017-04-08 NOTE — Telephone Encounter (Signed)
Dr Acie Fredrickson has signed the Eliquis PA form and I have faxed it back to [facility].

## 2017-04-10 NOTE — Telephone Encounter (Addendum)
**Note De-Identified Zygmunt Mcglinn Obfuscation** Approval received Rola Lennon fax from [facility] on the pts Eliquis PA. Approval good until 04/08/2018.  I have notified the pts pharmacy, [place].

## 2017-10-04 ENCOUNTER — Other Ambulatory Visit: Payer: Self-pay | Admitting: Cardiovascular Disease

## 2017-10-12 ENCOUNTER — Ambulatory Visit (INDEPENDENT_AMBULATORY_CARE_PROVIDER_SITE_OTHER): Payer: Medicare Other | Admitting: Cardiovascular Disease

## 2017-10-12 ENCOUNTER — Encounter: Payer: Self-pay | Admitting: Cardiovascular Disease

## 2017-10-12 ENCOUNTER — Encounter (INDEPENDENT_AMBULATORY_CARE_PROVIDER_SITE_OTHER): Payer: Self-pay

## 2017-10-12 VITALS — BP 102/70 | HR 63 | Ht 71.0 in | Wt 260.0 lb

## 2017-10-12 DIAGNOSIS — I481 Persistent atrial fibrillation: Secondary | ICD-10-CM | POA: Diagnosis not present

## 2017-10-12 DIAGNOSIS — I4819 Other persistent atrial fibrillation: Secondary | ICD-10-CM

## 2017-10-12 MED ORDER — APIXABAN 5 MG PO TABS: 5.0000 mg | ORAL_TABLET | Freq: Two times a day (BID) | ORAL | 3 refills | Status: DC

## 2017-10-12 MED ORDER — ATORVASTATIN CALCIUM 40 MG PO TABS: 40.0000 mg | ORAL_TABLET | Freq: Every day | ORAL | 3 refills | Status: DC

## 2017-10-12 MED ORDER — METOPROLOL SUCCINATE ER 100 MG PO TB24: ORAL_TABLET | ORAL | 3 refills | Status: DC

## 2017-10-12 NOTE — Progress Notes (Signed)
Cardiology Office Note   Date:  10/12/2017   ID:  [name], DOB 01-15-1946, MRN [id number]  PCP:  Marcelina Morel, MD  Cardiologist:   Mertie Moores, MD   Chief Complaint  Patient presents with  . Atrial Fibrillation   1. Paroxysmal atrial fibrillation 2. Hypertension 3. Hyperlipidemia 4. Knee arthritis -       Louis Fuller is a 72 y.o. gentleman with a Hx of paroxysmal Atrial Fibrillation. He notices it significant heart irregularities after he comes in from own grass or doing other yard work. He takes an extra half of of Toprol that seemed to help. He gets some extra exercise  March 04, 2012:  Louis Fuller is doing well. He has some rare episodes of palpitations that clinically sound like atrial fib. The episode lasted most of the night. He takes propranolol on occasion for these episodes of A-Fib.  July 05, 2013:  He has moved to [place] since I last saw him. He needs to have knee replacements.  He has vericose veins in his legs and has leg swelling. No CP, no dyspnea.  He is in A-fib today at his office visit.   August 03, 2013:  He is still in atrial fib. He is basically asymptomatic. He's able to do all of his normal activities without any problems. He denies any chest pain or shortness of breath. He has not had his myoview yet.  Echo revealed. Normal left ventricular systolic function and mild mitral regurgitation, trivial aortic insufficiency.   August 23, 2013: Louis Fuller is seen back - he is still A. symptomatically and basically cannot tell that is in atrial fibrillation. He remains in atrial fibrillation. He's able to do all his normal activities without any issues. He's tolerating the Flecainide well. He's on Eliquis and has not noted any bleeding.  Sept. 1, 2015: Louis Fuller is seen - doing well with current meds.  Ready to have a cardioversion scheduled.  Nov. 3, 2015: Louis Fuller had a successful cardioversion about a month or so ago. Several days after  that he felt that his heart was beating correctly. He is seen today for followup and in fact is back in atrial fibrillation. He continues to be asymptomatic.    Jun 15, 2014: Louis Fuller is a 72 y.o. male who presents for follow-up of his atrial fibrillation. He has been seen by Dr. Rayann Heman for consideration for A. fib ablation. Given his lack of symptoms, A. fib ablation is not currently indicated. He declined one to start Tikosyn. His echocardiogram in June, 2015 showed normal left ventricle systolic function.  September 14, 2014:  Doing well.  No symptoms  Limited by knee pain .   Feb. 14, 2017: Doing well from a cardiac standpoint.   Lots of other issues  - was found to have a precancerous lesion on his colon.  Needs to have 4 teeth removed. Will hold his Eliquis for 2 days prior.   Wife, [name] had a mass removed from her head.   Might be an infection .   Sept. 18, 2017:  Doing well.   HR is well controlled.     All other issues are well controlled.   Has colon polyps ,  Gets colonoscopy yearly .   Sept. 11, 2018:  Wt is 82 today  Louis Fuller is seen for follow up of his chronic Afib. Has mild LV dysfunction by echo in May 2016, mild MR  Has been doing ok Is having a colonocsopy Friday  Will take the last dose of Eliquis on Wednesday .   Sept. 9, 2019: Louis Fuller is seen for his chronic Afib. No cp or dyspnea.   Is not exercising as much as he should  Doing well .   We discussed weight .   Advised weight loss    Past Medical History:  Diagnosis Date  . Arthritis   . Colon polyp   . Dysrhythmia   . ED (erectile dysfunction)   . Excessive drinking of alcohol   . Gastritis   . GERD (gastroesophageal reflux disease)   . Hypercholesterolemia   . 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 Longstanding persistent atrial fibrillation ([place])   . Mitral regurgitation   . Obesity   . Peripheral vascular disease ([place])   . Varicose veins     Past Surgical  History:  Procedure Laterality Date  . CARDIOVERSION N/A 10/06/2013   Procedure: CARDIOVERSION;  Surgeon: Lelon Perla, MD;  Location: [facility] ENDOSCOPY;  Service: Cardiovascular;  Laterality: N/A;  . CATARACT EXTRACTION     left  . CATARACT EXTRACTION    . COLONOSCOPY    . COLONOSCOPY WITH PROPOFOL N/A 12/26/2014   Procedure: COLONOSCOPY WITH PROPOFOL;  Surgeon: Lollie Sails, MD;  Location: [facility] ENDOSCOPY;  Service: Endoscopy;  Laterality: N/A;  . COLONOSCOPY WITH PROPOFOL N/A 03/27/2015   Procedure: COLONOSCOPY WITH PROPOFOL;  Surgeon: Lollie Sails, MD;  Location: [facility] ENDOSCOPY;  Service: Endoscopy;  Laterality: N/A;  . COLONOSCOPY WITH PROPOFOL N/A 10/22/2016   Procedure: COLONOSCOPY WITH PROPOFOL;  Surgeon: Toledo, Benay Pike, MD;  Location: [facility] ENDOSCOPY;  Service: Endoscopy;  Laterality: N/A;  . ENDOVENOUS ABLATION SAPHENOUS VEIN W/ LASER    . ESOPHAGOGASTRODUODENOSCOPY (EGD) WITH PROPOFOL N/A 12/26/2014   Procedure: ESOPHAGOGASTRODUODENOSCOPY (EGD) WITH PROPOFOL;  Surgeon: Lollie Sails, MD;  Location: [facility] ENDOSCOPY;  Service: Endoscopy;  Laterality: N/A;  . EYE SURGERY    . vein laser surgery       Current Outpatient Medications  Medication Sig Dispense Refill  . atorvastatin (LIPITOR) 40 MG tablet Take 1 tablet by mouth daily.    . Carboxymethylcellul-Glycerin (REFRESH OPTIVE OP) Place 1 drop into both eyes daily as needed (for itchy eyes).     [name] Kitchen desonide (DESOWEN) 0.05 % ointment Apply 1 application topically daily as needed. Apply to affected areas topically as needed.    . digoxin (LANOXIN) 0.25 MG tablet Take 1 tablet by mouth daily.    [name] Kitchen ELIQUIS 5 MG TABS tablet TAKE 1 TABLET TWICE A DAY 60 tablet 0  . metoprolol succinate (TOPROL-XL) 100 MG 24 hr tablet TAKE 1 TABLET DAILY. TAKE WITH OR IMMEDIATELY FOLLOWING A MEAL 90 tablet 3  . Multiple Vitamin (MULTIVITAMIN) tablet Take 1 tablet by mouth daily.      [name] Kitchen omeprazole (PRILOSEC) 20 MG capsule Take 20 mg by mouth  daily.      . sildenafil (REVATIO) 20 MG tablet Take 20 mg by mouth daily as needed for erectile dysfunction.     No current facility-administered medications for this visit.     Allergies:   Patient has no known allergies.    Social History:  The patient  reports that he has never smoked. He has never used smokeless tobacco. He reports that he drinks about 14.0 standard drinks of alcohol per week. He reports that he does not use drugs.   Family History:  The patient's Family history is unknown by patient.    ROS:  Please see the history of present illness.    Physical Exam: Blood pressure 102/70, pulse 63, height 5\' 11"  (1.803 m), weight 260 lb (117.9 kg), SpO2 94 %.  GEN:  Well nourished, well developed in no acute distress HEENT: Normal NECK: No JVD; No carotid bruits LYMPHATICS: No lymphadenopathy CARDIAC:  Irreg. Irreg.  RESPIRATORY:  Clear to auscultation without rales, wheezing or rhonchi  ABDOMEN: Soft, non-tender, non-distended MUSCULOSKELETAL:  No edema; No deformity  SKIN: Warm and dry NEUROLOGIC:  Alert and oriented x 3   EKG:   Sept. 16, 2019:   Atrial fib at 53.   No St or T wave change      Recent Labs: No results found for requested labs within last 8760 hours.    Lipid Panel    Component Value Date/Time   CHOL 157 10/07/2016 1241   TRIG 197 (H) 10/07/2016 1241   HDL 42 10/07/2016 1241   CHOLHDL 3.7 10/07/2016 1241   CHOLHDL 4.4 03/13/2015 1152   VLDL 33 (H) 03/13/2015 1152   LDLCALC 76 10/07/2016 1241       Wt Readings from Last 3 Encounters:  10/12/17 260 lb (117.9 kg)  11/20/16 255 lb (115.7 kg)  10/22/16 250 lb (113.4 kg)      Other studies Reviewed: Additional studies/ records that were reviewed today include: . Review of the above records demonstrates:    ASSESSMENT AND PLAN:  1. Persistent atrial fibrillation -  Louis Fuller is doing well.  Continue Eliquis.  He is on Toprol-XL 100 mg a day.  I think that we can stop his digoxin at  this point.    2. Hypertension - BP is now stable .  Continue current medications.  3. Hyperlipidemia -   managed by his primary medical doctor.  His last total cholesterol level was 163.  Triglyceride levels 181.  His LDL is 82.  The HDL is 45.  4. Knee arthritis - needs knee replacement   Current medicines are reviewed at length with the patient today.  The patient does not have concerns regarding medicines.  The following changes have been made:  no change  Labs/ tests ordered today include:   No orders of the defined types were placed in this encounter.  Disposition:   FU with me in 1 year .       Mertie Moores, MD  10/12/2017 11:25 AM    [address], [place], [place]  [address] Phone: [phone]; Fax: [phone]

## 2017-10-12 NOTE — Patient Instructions (Signed)
Medication Instructions:  Your physician has recommended you make the following change in your medication:   STOP Digoxin (Lanoxin)   Labwork: None Ordered   Testing/Procedures: None Ordered   Follow-Up: Your physician wants you to follow-up in: 1 year with Dr. Acie Fredrickson. You will receive a reminder letter in the mail two months in advance. If you don't receive a letter, please call our office to schedule the follow-up appointment.   If you need a refill on your cardiac medications before your next appointment, please call your pharmacy.   Thank you for choosing [facility]! Christen Bame, RN [phone]

## 2017-11-11 ENCOUNTER — Encounter (INDEPENDENT_AMBULATORY_CARE_PROVIDER_SITE_OTHER): Payer: Medicare Other | Admitting: Ophthalmology

## 2017-11-19 ENCOUNTER — Encounter (INDEPENDENT_AMBULATORY_CARE_PROVIDER_SITE_OTHER): Payer: Medicare Other | Admitting: Ophthalmology

## 2017-11-19 DIAGNOSIS — H43813 Vitreous degeneration, bilateral: Secondary | ICD-10-CM

## 2017-11-19 DIAGNOSIS — H35372 Puckering of macula, left eye: Secondary | ICD-10-CM | POA: Diagnosis not present

## 2018-01-21 DIAGNOSIS — M25561 Pain in right knee: Secondary | ICD-10-CM | POA: Insufficient documentation

## 2018-01-21 DIAGNOSIS — M1712 Unilateral primary osteoarthritis, left knee: Secondary | ICD-10-CM | POA: Insufficient documentation

## 2018-04-12 ENCOUNTER — Telehealth: Payer: Self-pay

## 2018-04-12 NOTE — Telephone Encounter (Signed)
**Note De-Identified Louis Fuller Obfuscation** Dr Acie Fredrickson has signed the form and I have faxed it to [facility].

## 2018-04-12 NOTE — Telephone Encounter (Signed)
**Note De-Identified Thereasa Iannello Obfuscation** We received an Eliquis PA authorization form Dayzha Pogosyan fax from [place]. I have completed the form and gave it to Dr Lanny Hurst nurse awaiting Dr Elmarie Shiley signature.

## 2018-04-14 NOTE — Telephone Encounter (Signed)
**Note De-Identified Maks Cavallero Obfuscation** Letter received from [facility] stating that they have approved the pts Eliquis PA. Approval good from 03/13/2018 through 04/12/2019.  I have notified the pts pharmacy.

## 2018-10-07 ENCOUNTER — Other Ambulatory Visit: Payer: Self-pay | Admitting: Cardiovascular Disease

## 2018-10-07 NOTE — Telephone Encounter (Signed)
Eliquis 5mg  refill request received; pt is 73 yrs old, weight-117.9kg, Crea-1.00 on 03/10/2018 via [facility] from [place], [place], and last seen by on Nahser on 10/12/2017. Dose is appropriate based on dosing criteria. Will send in refill to requested pharmacy.

## 2018-11-04 ENCOUNTER — Telehealth: Payer: Self-pay | Admitting: *Deleted

## 2018-11-04 NOTE — Telephone Encounter (Signed)
Patient with diagnosis of afib on Eliquis for anticoagulation.    Procedure: LEFT TOTAL KNEE REPLACEMENT   Date of procedure: TBD  CHADS2-VASc score of  3 (HTN, AGE, CAD)  CrCl 89 ml/min  Per office protocol, patient can hold Elqiuis for 3 days prior to procedure.

## 2018-11-04 NOTE — Telephone Encounter (Signed)
   [facility] Pre-operative Risk Assessment    Request for surgical clearance:  1. What type of surgery is being performed? LEFT TOTAL KNEE REPLACEMENT    2. When is this surgery scheduled? TBD   3. What type of clearance is required (medical clearance vs. Pharmacy clearance to hold med vs. Both)? BOTH  4. Are there any medications that need to be held prior to surgery and how long? ELIQUIS   5. Practice name and name of physician performing surgery? [facility]; DR. Elsie Saas   6. What is your office phone number [phone] EXT [phone]    7.   What is your office fax number [phone]  8.   Anesthesia type (None, local, MAC, general) ? CHOICE   Julaine Hua 11/04/2018, 3:45 PM  _________________________________________________________________   (provider comments below)

## 2018-11-05 NOTE — Telephone Encounter (Signed)
Pt has appt with Dr. Acie Fredrickson 11/9. I will route clearance information to MD for appt. I will route to surgeon's office as FYI that pt being seen 11/9 for surgery clearance. I will remove from the call back pool.

## 2018-11-05 NOTE — Telephone Encounter (Signed)
   Primary Cardiologist:Philip Nahser, MD  Chart reviewed as part of pre-operative protocol coverage. Because of Louis Fuller's past medical history and time since last visit, he/she will require a follow-up visit in order to better assess preoperative cardiovascular risk. Pt has an upcoming appointment on 12/06/18 with Dr. Acie Fredrickson.   Pre-op covering staff: - Please contact requesting surgeon's office via preferred method (i.e, phone, fax) to inform them of need for appointment prior to surgery. -Please add pre-op clearance to upcoming appointment notes   If applicable, this message will also be routed to pharmacy pool and/or primary cardiologist for input on holding anticoagulant/antiplatelet agent as requested below so that this information is available at time of patient's appointment.   Kathyrn Drown, NP  11/05/2018, 7:09 AM

## 2018-11-24 ENCOUNTER — Encounter (INDEPENDENT_AMBULATORY_CARE_PROVIDER_SITE_OTHER): Payer: Medicare Other | Admitting: Ophthalmology

## 2018-11-24 DIAGNOSIS — H43813 Vitreous degeneration, bilateral: Secondary | ICD-10-CM | POA: Diagnosis not present

## 2018-11-24 DIAGNOSIS — H35372 Puckering of macula, left eye: Secondary | ICD-10-CM

## 2018-11-25 ENCOUNTER — Encounter (INDEPENDENT_AMBULATORY_CARE_PROVIDER_SITE_OTHER): Payer: Medicare Other | Admitting: Ophthalmology

## 2018-12-05 ENCOUNTER — Other Ambulatory Visit: Payer: Self-pay | Admitting: Cardiovascular Disease

## 2018-12-05 NOTE — Progress Notes (Signed)
Cardiology Office Note   Date:  12/06/2018   ID:  Louis Fuller, DOB 09/11/1945, MRN [id number]  PCP:  Marcelina Morel, MD  Cardiologist:   Mertie Moores, MD   No chief complaint on file.  1. Paroxysmal atrial fibrillation 2. Hypertension 3. Hyperlipidemia 4. Knee arthritis -       Louis Fuller is a 73 y.o. gentleman with a Hx of paroxysmal Atrial Fibrillation. He notices it significant heart irregularities after he comes in from own grass or doing other yard work. He takes an extra half of of Toprol that seemed to help. He gets some extra exercise  March 04, 2012:  Louis Fuller is doing well. He has some rare episodes of palpitations that clinically sound like atrial fib. The episode lasted most of the night. He takes propranolol on occasion for these episodes of A-Fib.  July 05, 2013:  He has moved to [place] since I last saw him. He needs to have knee replacements.  He has vericose veins in his legs and has leg swelling. No CP, no dyspnea.  He is in A-fib today at his office visit.   August 03, 2013:  He is still in atrial fib. He is basically asymptomatic. He's able to do all of his normal activities without any problems. He denies any chest pain or shortness of breath. He has not had his myoview yet.  Echo revealed. Normal left ventricular systolic function and mild mitral regurgitation, trivial aortic insufficiency.   August 23, 2013: Louis Fuller is seen back - he is still A. symptomatically and basically cannot tell that is in atrial fibrillation. He remains in atrial fibrillation. He's able to do all his normal activities without any issues. He's tolerating the Flecainide well. He's on Eliquis and has not noted any bleeding.  Sept. 1, 2015: Louis Fuller is seen - doing well with current meds.  Ready to have a cardioversion scheduled.  Nov. 3, 2015: Louis Fuller had a successful cardioversion about a month or so ago. Several days after that he felt that his heart was beating  correctly. He is seen today for followup and in fact is back in atrial fibrillation. He continues to be asymptomatic.    Jun 15, 2014: Louis Fuller is a 73 y.o. male who presents for follow-up of his atrial fibrillation. He has been seen by Dr. Rayann Heman for consideration for A. fib ablation. Given his lack of symptoms, A. fib ablation is not currently indicated. He declined one to start Tikosyn. His echocardiogram in June, 2015 showed normal left ventricle systolic function.  September 14, 2014:  Doing well.  No symptoms  Limited by knee pain .   Feb. 14, 2017: Doing well from a cardiac standpoint.   Lots of other issues  - was found to have a precancerous lesion on his colon.  Needs to have 4 teeth removed. Will hold his Eliquis for 2 days prior.   Wife, [name] had a mass removed from her head.   Might be an infection .   Sept. 18, 2017:  Doing well.   HR is well controlled.     All other issues are well controlled.   Has colon polyps ,  Gets colonoscopy yearly .   Sept. 11, 2018:  Wt is 44 today  Louis Fuller is seen for follow up of his chronic Afib. Has mild LV dysfunction by echo in May 2016, mild MR  Has been doing ok Is having a colonocsopy Friday  Will take the last dose of  Eliquis on Wednesday .   Sept. 9, 2019: Louis Fuller is seen for his chronic Afib. No cp or dyspnea.   Is not exercising as much as he should  Doing well .   We discussed weight .   Advised weight loss   Nov. 9, 2020  Louis Fuller is seen for pre op eval  For knee surgery .  Overall doing well.  Wife, [name] has has been busy taking care of her mother     Past Medical History:  Diagnosis Date  . Arthritis   . Colon polyp   . Dysrhythmia   . ED (erectile dysfunction)   . Excessive drinking of alcohol   . Gastritis   . GERD (gastroesophageal reflux disease)   . Hypercholesterolemia   . 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 Longstanding persistent atrial fibrillation ([place])   .  Mitral regurgitation   . Obesity   . Peripheral vascular disease ([place])   . Varicose veins     Past Surgical History:  Procedure Laterality Date  . CARDIOVERSION N/A 10/06/2013   Procedure: CARDIOVERSION;  Surgeon: Lelon Perla, MD;  Location: [facility] ENDOSCOPY;  Service: Cardiovascular;  Laterality: N/A;  . CATARACT EXTRACTION     left  . CATARACT EXTRACTION    . COLONOSCOPY    . COLONOSCOPY WITH PROPOFOL N/A 12/26/2014   Procedure: COLONOSCOPY WITH PROPOFOL;  Surgeon: Lollie Sails, MD;  Location: [facility] ENDOSCOPY;  Service: Endoscopy;  Laterality: N/A;  . COLONOSCOPY WITH PROPOFOL N/A 03/27/2015   Procedure: COLONOSCOPY WITH PROPOFOL;  Surgeon: Lollie Sails, MD;  Location[facility] ENDOSCOPY;  Service: Endoscopy;  Laterality: N/A;  . COLONOSCOPY WITH PROPOFOL N/A 10/22/2016   Procedure: COLONOSCOPY WITH PROPOFOL;  Surgeon: Toledo, Benay Pike, MD;  Location: [facility] ENDOSCOPY;  Service: Endoscopy;  Laterality: N/A;  . ENDOVENOUS ABLATION SAPHENOUS VEIN W/ LASER    . ESOPHAGOGASTRODUODENOSCOPY (EGD) WITH PROPOFOL N/A 12/26/2014   Procedure: ESOPHAGOGASTRODUODENOSCOPY (EGD) WITH PROPOFOL;  Surgeon: Lollie Sails, MD;  Location: [facility] ENDOSCOPY;  Service: Endoscopy;  Laterality: N/A;  . EYE SURGERY    . vein laser surgery       Current Outpatient Medications  Medication Sig Dispense Refill  . atorvastatin (LIPITOR) 40 MG tablet Take 1 tablet (40 mg total) by mouth daily. 90 tablet 3  . Carboxymethylcellul-Glycerin (REFRESH OPTIVE OP) Place 1 drop into both eyes daily as needed (for itchy eyes).     [name] Kitchen desonide (DESOWEN) 0.05 % ointment Apply 1 application topically daily as needed. Apply to affected areas topically as needed.    [name] Kitchen ELIQUIS 5 MG TABS tablet TAKE 1 TABLET TWICE A DAY 180 tablet 0  . metoprolol succinate (TOPROL-XL) 100 MG 24 hr tablet TAKE 1 TABLET DAILY. TAKE WITH OR IMMEDIATELY FOLLOWING A MEAL. Please make overdue appt with Dr. Acie Fredrickson before anymore refills. 1st attempt  30 tablet 0  . Multiple Vitamin (MULTIVITAMIN) tablet Take 1 tablet by mouth daily.      [name] Kitchen omeprazole (PRILOSEC) 20 MG capsule Take 20 mg by mouth daily.      . sildenafil (REVATIO) 20 MG tablet Take 20 mg by mouth daily as needed for erectile dysfunction.     No current facility-administered medications for this visit.     Allergies:   Patient has no known allergies.    Social History:  The patient  reports that he has never smoked. He has never used smokeless tobacco. He reports current alcohol use of about 14.0 standard  drinks of alcohol per week. He reports that he does not use drugs.   Family History:  The patient's Family history is unknown by patient.    ROS:  Please see the history of present illness.    Physical Exam: Blood pressure 110/88, pulse 84, height 5\' 10"  (1.778 m), weight 272 lb 6.4 oz (123.6 kg), SpO2 91 %.  GEN:  Well nourished, well developed in no acute distress HEENT: Normal NECK: No JVD; No carotid bruits LYMPHATICS: No lymphadenopathy CARDIAC: irreg. Irreg.  RESPIRATORY:  Clear to auscultation without rales, wheezing or rhonchi  ABDOMEN: Soft, non-tender, non-distended MUSCULOSKELETAL:  No edema; No deformity  SKIN: Warm and dry NEUROLOGIC:  Alert and oriented x 3    EKG:   Nov. 9, 2020   .   Atrial fib at 84.    Recent Labs: No results found for requested labs within last 8760 hours.    Lipid Panel    Component Value Date/Time   CHOL 157 10/07/2016 1241   TRIG 197 (H) 10/07/2016 1241   HDL 42 10/07/2016 1241   CHOLHDL 3.7 10/07/2016 1241   CHOLHDL 4.4 03/13/2015 1152   VLDL 33 (H) 03/13/2015 1152   LDLCALC 76 10/07/2016 1241       Wt Readings from Last 3 Encounters:  12/06/18 272 lb 6.4 oz (123.6 kg)  10/12/17 260 lb (117.9 kg)  11/20/16 255 lb (115.7 kg)      Other studies Reviewed: Additional studies/ records that were reviewed today include: . Review of the above records demonstrates:    ASSESSMENT AND PLAN:  1.  Persistent atrial fibrillation -  Chronic AF .   Cont. Eliquis   HR is well controlled  There needs to have knee surgery.  He has not decided exactly when he will have it.  He will be at low risk from a cardiac standpoint.  Okay to hold Eliquis for 2 days prior to surgery.  If he needs spinal anesthesia, it would be okay to hold Eliquis for 3 days.  2. Hypertension -blood pressure is well controlled.  Continue current medications.  3. Hyperlipidemia -  Check labs today   4. Knee arthritis -  Needs to have surgery at some point He is at low risk for his knee surgery .  May hold Eliquis for 2-3 days prior to surgery      Current medicines are reviewed at length with the patient today.  The patient does not have concerns regarding medicines.  The following changes have been made:  no change  Labs/ tests ordered today include:   No orders of the defined types were placed in this encounter.  Disposition:   FU with me in 1 year .       Mertie Moores, MD  12/06/2018 11:33 AM    [facility] [address], [place], [place]  [address] Phone: [phone]; Fax: [phone]

## 2018-12-06 ENCOUNTER — Encounter: Payer: Self-pay | Admitting: Cardiovascular Disease

## 2018-12-06 ENCOUNTER — Ambulatory Visit (INDEPENDENT_AMBULATORY_CARE_PROVIDER_SITE_OTHER): Payer: Medicare Other | Admitting: Cardiovascular Disease

## 2018-12-06 ENCOUNTER — Other Ambulatory Visit: Payer: Self-pay

## 2018-12-06 VITALS — BP 110/88 | HR 84 | Ht 70.0 in | Wt 272.4 lb

## 2018-12-06 DIAGNOSIS — E785 Hyperlipidemia, unspecified: Secondary | ICD-10-CM | POA: Diagnosis not present

## 2018-12-06 DIAGNOSIS — I4819 Other persistent atrial fibrillation: Secondary | ICD-10-CM

## 2018-12-06 DIAGNOSIS — Z7901 Long term (current) use of anticoagulants: Secondary | ICD-10-CM

## 2018-12-06 MED ORDER — APIXABAN 5 MG PO TABS: 5.0000 mg | ORAL_TABLET | Freq: Two times a day (BID) | ORAL | 3 refills | Status: DC

## 2018-12-06 MED ORDER — METOPROLOL SUCCINATE ER 100 MG PO TB24: ORAL_TABLET | ORAL | 3 refills | Status: DC

## 2018-12-06 MED ORDER — ATORVASTATIN CALCIUM 40 MG PO TABS: 40.0000 mg | ORAL_TABLET | Freq: Every day | ORAL | 3 refills | Status: DC

## 2018-12-06 NOTE — Patient Instructions (Signed)
Medication Instructions:  Your physician recommends that you continue on your current medications as directed. Please refer to the Current Medication list given to you today.  *If you need a refill on your cardiac medications before your next appointment, please call your pharmacy*  Lab Work: TODAY - cholesterol, CBC, liver panel, basic metabolic panel If you have labs (blood work) drawn today and your tests are completely normal, you will receive your results only by: [name] Kitchen MyChart Message (if you have MyChart) OR . A paper copy in the mail If you have any lab test that is abnormal or we need to change your treatment, we will call you to review the results.   Testing/Procedures: None Ordered   Follow-Up: At [facility], you and your health needs are our priority.  As part of our continuing mission to provide you with exceptional heart care, we have created designated Provider Care Teams.  These Care Teams include your primary Cardiologist (physician) and Advanced Practice Providers (APPs -  Physician Assistants and Nurse Practitioners) who all work together to provide you with the care you need, when you need it.  Your next appointment:   12 months  The format for your next appointment:   In Person  Provider:   You may see Mertie Moores, MD or one of the following Advanced Practice Providers on your designated Care Team:    Richardson Dopp, PA-C  [place], [place]  Daune Perch, [place]

## 2018-12-07 LAB — BASIC METABOLIC PANEL
BUN/Creatinine Ratio: 13 (ref 10–24)
BUN: 12 mg/dL (ref 8–27)
CO2: 24 mmol/L (ref 20–29)
Calcium: 9.2 mg/dL (ref 8.6–10.2)
Chloride: 102 mmol/L (ref 96–106)
Creatinine, Ser: 0.91 mg/dL (ref 0.76–1.27)
GFR calc Af Amer: 98 mL/min/{1.73_m2} (ref >59)
GFR calc non Af Amer: 85 mL/min/{1.73_m2} (ref >59)
Glucose: 84 mg/dL (ref 65–99)
Potassium: 5.1 mmol/L (ref 3.5–5.2)
Sodium: 142 mmol/L (ref 134–144)

## 2018-12-07 LAB — LIPID PANEL
Chol/HDL Ratio: 2.5 ratio (ref 0.0–5.0)
Cholesterol, Total: 163 mg/dL (ref 100–199)
HDL: 65 mg/dL (ref >39)
LDL Chol Calc (NIH): 81 mg/dL (ref 0–99)
Triglycerides: 90 mg/dL (ref 0–149)
VLDL Cholesterol Cal: 17 mg/dL (ref 5–40)

## 2018-12-07 LAB — HEPATIC FUNCTION PANEL
ALT: 43 IU/L (ref 0–44)
AST: 26 IU/L (ref 0–40)
Albumin: 4.4 g/dL (ref 3.8–4.8)
Alkaline Phosphatase: 92 IU/L (ref 39–117)
Bilirubin Total: 0.8 mg/dL (ref 0.0–1.2)
Bilirubin, Direct: 0.24 mg/dL (ref 0.00–0.40)
Total Protein: 6.4 g/dL (ref 6.0–8.5)

## 2019-02-17 ENCOUNTER — Ambulatory Visit: Payer: Medicare Other | Attending: Internal Medicine

## 2019-02-17 DIAGNOSIS — Z23 Encounter for immunization: Secondary | ICD-10-CM | POA: Insufficient documentation

## 2019-02-17 NOTE — Progress Notes (Signed)
   Covid-19 Vaccination Clinic  Name:  Louis Fuller    MRN: [id number] DOB: 11/27/1945  02/17/2019  Louis Fuller was observed post Covid-19 immunization for 15 minutes without incidence. He was provided with Vaccine Information Sheet and instruction to access the V-Safe system.   Louis Fuller was instructed to call 911 with any severe reactions post vaccine: [name] Kitchen Difficulty breathing  . Swelling of your face and throat  . A fast heartbeat  . A bad rash all over your body  . Dizziness and weakness    Immunizations Administered    Name Date Dose VIS Date Route   Pfizer COVID-19 Vaccine 02/17/2019 10:32 AM 0.3 mL 01/07/2019 Intramuscular   Manufacturer: [place]   Lot: [id number]   [place]: [id number]

## 2019-02-22 ENCOUNTER — Ambulatory Visit: Payer: Medicare Other

## 2019-03-06 ENCOUNTER — Ambulatory Visit: Payer: Medicare Other

## 2019-03-10 ENCOUNTER — Ambulatory Visit: Payer: Medicare Other | Attending: Internal Medicine

## 2019-03-10 DIAGNOSIS — Z23 Encounter for immunization: Secondary | ICD-10-CM | POA: Insufficient documentation

## 2019-03-10 NOTE — Progress Notes (Signed)
   Covid-19 Vaccination Clinic  Name:  Louis Fuller    MRN: [id number] DOB: Jul 11, 1945  03/10/2019  [name] was observed post Covid-19 immunization for 15 minutes without incidence. He was provided with Vaccine Information Sheet and instruction to access the V-Safe system.   [name] was instructed to call 911 with any severe reactions post vaccine: [name] Kitchen Difficulty breathing  . Swelling of your face and throat  . A fast heartbeat  . A bad rash all over your body  . Dizziness and weakness    Immunizations Administered    Name Date Dose VIS Date Route   Pfizer COVID-19 Vaccine 03/10/2019 10:46 AM 0.3 mL 01/07/2019 Intramuscular   Manufacturer: [place]   Lot: [id number]   [place]: [id number]

## 2019-06-13 ENCOUNTER — Telehealth: Payer: Self-pay

## 2019-06-13 NOTE — Telephone Encounter (Signed)
I did an urgent Eliquis PA through covermymeds and received the following message: [place] Key: [id number] - PA Case ID: [id number] Outcome  This request has been approved using information available on the patient's profile.  Case Id: [id number]; Status:Approved;Review Type: Prior Auth Coverage Start Date:05/14/2019;Coverage End Date:06/12/2020 Drug Eliquis 5MG  tablets  [employer] Electronic PA Form (2017 NCPDP)  I have notified the pt that this Eliquis PA has been approved until 06/12/2020. He verbalized understanding and thanked me for my assistants.

## 2019-06-13 NOTE — Telephone Encounter (Signed)
Pt calling stating that he needs a prior auth on his medication Eliquis before he can get his medication. Jeani Hawking, LPN, can you please advise on this matter? Thank you

## 2019-11-14 ENCOUNTER — Other Ambulatory Visit: Payer: Self-pay | Admitting: Cardiovascular Disease

## 2019-11-25 ENCOUNTER — Encounter (INDEPENDENT_AMBULATORY_CARE_PROVIDER_SITE_OTHER): Payer: Medicare Other | Admitting: Ophthalmology

## 2019-11-25 ENCOUNTER — Other Ambulatory Visit: Payer: Self-pay

## 2019-11-25 DIAGNOSIS — H35372 Puckering of macula, left eye: Secondary | ICD-10-CM

## 2019-11-25 DIAGNOSIS — H43813 Vitreous degeneration, bilateral: Secondary | ICD-10-CM

## 2019-11-25 DIAGNOSIS — H35342 Macular cyst, hole, or pseudohole, left eye: Secondary | ICD-10-CM

## 2019-12-05 ENCOUNTER — Other Ambulatory Visit: Payer: Self-pay | Admitting: Cardiovascular Disease

## 2019-12-12 ENCOUNTER — Other Ambulatory Visit: Payer: Self-pay | Admitting: Cardiovascular Disease

## 2019-12-12 NOTE — Telephone Encounter (Signed)
Pt overdue to see Dr. Acie Fredrickson and for labs.  He has appt sched for 12/16/19.  Will send in 30 day supply and update after his visit.

## 2019-12-16 ENCOUNTER — Ambulatory Visit (INDEPENDENT_AMBULATORY_CARE_PROVIDER_SITE_OTHER): Payer: Medicare Other | Admitting: Cardiovascular Disease

## 2019-12-16 ENCOUNTER — Encounter: Payer: Self-pay | Admitting: Cardiovascular Disease

## 2019-12-16 ENCOUNTER — Other Ambulatory Visit: Payer: Self-pay

## 2019-12-16 VITALS — BP 134/92 | HR 79 | Ht 70.0 in | Wt 265.2 lb

## 2019-12-16 DIAGNOSIS — I4819 Other persistent atrial fibrillation: Secondary | ICD-10-CM

## 2019-12-16 DIAGNOSIS — Z7901 Long term (current) use of anticoagulants: Secondary | ICD-10-CM

## 2019-12-16 DIAGNOSIS — E785 Hyperlipidemia, unspecified: Secondary | ICD-10-CM

## 2019-12-16 MED ORDER — APIXABAN 5 MG PO TABS
5.0000 mg | ORAL_TABLET | Freq: Two times a day (BID) | ORAL | 3 refills | Status: DC
Start: 2019-12-16 — End: 2020-12-10

## 2019-12-16 MED ORDER — METOPROLOL SUCCINATE ER 100 MG PO TB24
ORAL_TABLET | ORAL | 3 refills | Status: DC
Start: 2019-12-16 — End: 2021-01-09

## 2019-12-16 MED ORDER — ATORVASTATIN CALCIUM 40 MG PO TABS
40.0000 mg | ORAL_TABLET | Freq: Every day | ORAL | 3 refills | Status: DC
Start: 2019-12-16 — End: 2021-01-30

## 2019-12-16 NOTE — Progress Notes (Signed)
Cardiology Office Note   Date:  12/16/2019   ID:  PERSEUS WESTALL, DOB 1945/11/10, MRN [id number]  PCP:  Marcelina Morel, MD  Cardiologist:   Mertie Moores, MD   Chief Complaint  Patient presents with  . Atrial Fibrillation   1. Paroxysmal atrial fibrillation 2. Hypertension 3. Hyperlipidemia 4. Knee arthritis -       Louis Fuller is a 74 y.o. gentleman with a Hx of paroxysmal Atrial Fibrillation. He notices it significant heart irregularities after he comes in from own grass or doing other yard work. He takes an extra half of of Toprol that seemed to help. He gets some extra exercise  March 04, 2012:  Louis Fuller is doing well. He has some rare episodes of palpitations that clinically sound like atrial fib. The episode lasted most of the night. He takes propranolol on occasion for these episodes of A-Fib.  July 05, 2013:  He has moved to [place] since I last saw him. He needs to have knee replacements.  He has vericose veins in his legs and has leg swelling. No CP, no dyspnea.  He is in A-fib today at his office visit.   August 03, 2013:  He is still in atrial fib. He is basically asymptomatic. He's able to do all of his normal activities without any problems. He denies any chest pain or shortness of breath. He has not had his myoview yet.  Echo revealed. Normal left ventricular systolic function and mild mitral regurgitation, trivial aortic insufficiency.   August 23, 2013: Louis Fuller is seen back - he is still A. symptomatically and basically cannot tell that is in atrial fibrillation. He remains in atrial fibrillation. He's able to do all his normal activities without any issues. He's tolerating the Flecainide well. He's on Eliquis and has not noted any bleeding.  Sept. 1, 2015: Louis Fuller is seen - doing well with current meds.  Ready to have a cardioversion scheduled.  Nov. 3, 2015: Louis Fuller had a successful cardioversion about a month or so ago. Several days after  that he felt that his heart was beating correctly. He is seen today for followup and in fact is back in atrial fibrillation. He continues to be asymptomatic.    Jun 15, 2014: Louis Fuller is a 74 y.o. male who presents for follow-up of his atrial fibrillation. He has been seen by Dr. Rayann Heman for consideration for A. fib ablation. Given his lack of symptoms, A. fib ablation is not currently indicated. He declined one to start Tikosyn. His echocardiogram in June, 2015 showed normal left ventricle systolic function.  September 14, 2014:  Doing well.  No symptoms  Limited by knee pain .   Feb. 14, 2017: Doing well from a cardiac standpoint.   Lots of other issues  - was found to have a precancerous lesion on his colon.  Needs to have 4 teeth removed. Will hold his Eliquis for 2 days prior.   Wife, Louis Fuller had a mass removed from her head.   Might be an infection .   Sept. 18, 2017:  Doing well.   HR is well controlled.     All other issues are well controlled.   Has colon polyps ,  Gets colonoscopy yearly .   Sept. 11, 2018:  Wt is 23 today  Louis Fuller is seen for follow up of his chronic Afib. Has mild LV dysfunction by echo in May 2016, mild MR  Has been doing ok Is having a colonocsopy Friday  Will take the last dose of Eliquis on Wednesday .   Sept. 9, 2019: Louis Fuller is seen for his chronic Afib. No cp or dyspnea.   Is not exercising as much as he should  Doing well .   We discussed weight .   Advised weight loss   Nov. 9, 2020  Louis Fuller is seen for pre op eval  For knee surgery .  Overall doing well.  Wife, Louis Fuller has has been busy taking care of her mother    12/16/2019: Louis Fuller has not had his knee surgery .  Wt. Is 265. Trying to lose weight .  Eating [place] bacon some  No cp, no dyspnea    Past Medical History:  Diagnosis Date  . Arthritis   . Colon polyp   . Dysrhythmia   . ED (erectile dysfunction)   . Excessive drinking of alcohol   . Gastritis   . GERD  (gastroesophageal reflux disease)   . Hypercholesterolemia   . 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 Longstanding persistent atrial fibrillation ([place])   . Mitral regurgitation   . Obesity   . Peripheral vascular disease ([place])   . Varicose veins     Past Surgical History:  Procedure Laterality Date  . CARDIOVERSION N/A 10/06/2013   Procedure: CARDIOVERSION;  Surgeon: Lelon Perla, MD;  Location: [facility] ENDOSCOPY;  Service: Cardiovascular;  Laterality: N/A;  . CATARACT EXTRACTION     left  . CATARACT EXTRACTION    . COLONOSCOPY    . COLONOSCOPY WITH PROPOFOL N/A 12/26/2014   Procedure: COLONOSCOPY WITH PROPOFOL;  Surgeon: Lollie Sails, MD;  Location: [facility] ENDOSCOPY;  Service: Endoscopy;  Laterality: N/A;  . COLONOSCOPY WITH PROPOFOL N/A 03/27/2015   Procedure: COLONOSCOPY WITH PROPOFOL;  Surgeon: Lollie Sails, MD;  Location: [facility] ENDOSCOPY;  Service: Endoscopy;  Laterality: N/A;  . COLONOSCOPY WITH PROPOFOL N/A 10/22/2016   Procedure: COLONOSCOPY WITH PROPOFOL;  Surgeon: Toledo, Benay Pike, MD;  Location: [facility] ENDOSCOPY;  Service: Endoscopy;  Laterality: N/A;  . ENDOVENOUS ABLATION SAPHENOUS VEIN W/ LASER    . ESOPHAGOGASTRODUODENOSCOPY (EGD) WITH PROPOFOL N/A 12/26/2014   Procedure: ESOPHAGOGASTRODUODENOSCOPY (EGD) WITH PROPOFOL;  Surgeon: Lollie Sails, MD;  Location: [facility] ENDOSCOPY;  Service: Endoscopy;  Laterality: N/A;  . EYE SURGERY    . vein laser surgery       Current Outpatient Medications  Medication Sig Dispense Refill  . apixaban (ELIQUIS) 5 MG TABS tablet Take 1 tablet (5 mg total) by mouth 2 (two) times daily. 180 tablet 3  . atorvastatin (LIPITOR) 40 MG tablet Take 1 tablet (40 mg total) by mouth daily. 90 tablet 3  . Carboxymethylcellul-Glycerin (REFRESH OPTIVE OP) Place 1 drop into both eyes daily as needed (for itchy eyes).     [name] Kitchen desonide (DESOWEN) 0.05 % ointment Apply 1 application topically daily as needed. Apply to affected  areas topically as needed.    . metoprolol succinate (TOPROL-XL) 100 MG 24 hr tablet TAKE 1 TABLET DAILY. TAKE WITH OR IMMEDIATELY FOLLOWING A MEAL. 90 tablet 3  . Multiple Vitamin (MULTIVITAMIN) tablet Take 1 tablet by mouth daily.      [name] Kitchen omeprazole (PRILOSEC) 20 MG capsule Take 20 mg by mouth daily.      . sildenafil (REVATIO) 20 MG tablet Take 20 mg by mouth daily as needed for erectile dysfunction.     No current facility-administered medications for this visit.    Allergies:   Patient has no known allergies.  Social History:  The patient  reports that he has never smoked. He has never used smokeless tobacco. He reports current alcohol use of about 14.0 standard drinks of alcohol per week. He reports that he does not use drugs.   Family History:  The patient's Family history is unknown by patient.    ROS:  Please see the history of present illness.   Physical Exam: Blood pressure (!) 134/92, pulse 79, height 5\' 10"  (1.778 m), weight 265 lb 3.2 oz (120.3 kg), SpO2 99 %.  GEN:  meoderately obese , elderly male,  NAD  HEENT: Normal NECK: No JVD; No carotid bruits LYMPHATICS: No lymphadenopathy CARDIAC: irreg. Irreg.  RESPIRATORY:  Clear to auscultation without rales, wheezing or rhonchi  ABDOMEN: Soft, non-tender, non-distended MUSCULOSKELETAL:  1 + edema  SKIN: Warm and dry NEUROLOGIC:  Alert and oriented x 3    EKG:     December 16, 2019: Atrial fibrillation with a controlled ventricular response.  No ST or T wave changes.  No changes from previous EKG.  Recent Labs: No results found for requested labs within last 8760 hours.    Lipid Panel    Component Value Date/Time   CHOL 163 12/06/2018 1150   TRIG 90 12/06/2018 1150   HDL 65 12/06/2018 1150   CHOLHDL 2.5 12/06/2018 1150   CHOLHDL 4.4 03/13/2015 1152   VLDL 33 (H) 03/13/2015 1152   LDLCALC 81 12/06/2018 1150       Wt Readings from Last 3 Encounters:  12/16/19 265 lb 3.2 oz (120.3 kg)  12/06/18 272 lb  6.4 oz (123.6 kg)  10/12/17 260 lb (117.9 kg)      Other studies Reviewed: Additional studies/ records that were reviewed today include: . Review of the above records demonstrates:    ASSESSMENT AND PLAN:  1. Persistent atrial fibrillation -remains in atrial fibrillation.  Continue current medication.  Continue Eliquis.  His labs from [facility] in May, 2021 look good.   2. Hypertension -he still eating some excess salt.  i've  advised him to not eat [place] bacon and other salty foods.   3. Hyperlipidemia -    Managed by his primary medical doctor. 4. Knee arthritis -         Current medicines are reviewed at length with the patient today.  The patient does not have concerns regarding medicines.  The following changes have been made:  no change  Labs/ tests ordered today include:   Orders Placed This Encounter  Procedures  . EKG 12-Lead   Disposition:   FU with me in 1 year .       Mertie Moores, MD  12/16/2019 6:19 PM    [facility] [facility] [address], [place], [place]  [address] Phone: [phone]; Fax: [phone]

## 2019-12-16 NOTE — Patient Instructions (Signed)

## 2020-03-14 ENCOUNTER — Telehealth: Payer: Self-pay | Admitting: Cardiovascular Disease

## 2020-03-14 NOTE — Telephone Encounter (Signed)
   [facility] Pre-operative Risk Assessment    HEARTCARE STAFF: - Please ensure there is not already an duplicate clearance open for this procedure. - Under Visit Info/Reason for Call, type in Other and utilize the format Clearance MM/DD/YY or Clearance TBD. Do not use dashes or single digits. - If request is for dental extraction, please clarify the # of teeth to be extracted.  Request for surgical clearance:  1. What type of surgery is being performed? colonoscopy   2. When is this surgery scheduled? 05/02/20  3. What type of clearance is required (medical clearance vs. Pharmacy clearance to hold med vs. Both)? pharm  4. Are there any medications that need to be held prior to surgery and how long?advise on eliquis  5. Practice name and name of physician performing surgery? [place], [place]  6. What is the office phone number? [phone]   7.   What is the office fax number? [phone]  8.   Anesthesia type (None, local, MAC, general) ? Not noted   Marykay Lex 03/14/2020, 11:14 AM  _________________________________________________________________   (provider comments below)

## 2020-03-14 NOTE — Telephone Encounter (Signed)
Patient with diagnosis of atrial fibrillation on Eliquis for anticoagulation.    Procedure: colonoscopy Date of procedure: 05/02/20   CHA2DS2-VASc Score = 2  This indicates a 2.2% annual risk of stroke. The patient's score is based upon: CHF History: No HTN History: Yes Diabetes History: No Stroke History: No Vascular Disease History: No Age Score: 1 Gender Score: 0   CrCl 103.3  Platelet count 174  Per office protocol, patient can hold Eliquis for 2 days prior to procedure.   Patient will not need bridging with Lovenox (enoxaparin) around procedure.

## 2020-03-14 NOTE — Telephone Encounter (Signed)
Will route to PharmD for rec's re: holding anticoagulation. Richardson Dopp, PA-C    03/14/2020 11:42 AM

## 2020-03-15 NOTE — Telephone Encounter (Signed)
   Primary Cardiologist: Mertie Moores, MD  Chart reviewed as part of pre-operative protocol coverage. Given past medical history and time since last visit, based on ACC/AHA guidelines, [name] would be at acceptable risk for the planned procedure without further cardiovascular testing.   Patient with diagnosis of atrial fibrillation on Eliquis for anticoagulation.    Procedure: colonoscopy Date of procedure: 05/02/20  CHA2DS2-VASc Score = 2  This indicates a 2.2% annual risk of stroke. The patient's score is based upon: CHF History: No HTN History: Yes Diabetes History: No Stroke History: No Vascular Disease History: No Age Score: 1 Gender Score: 0   CrCl 103.3  Platelet count 174  Per office protocol, patient can hold Eliquis for 2 days prior to procedure. Patient will not need bridging with Lovenox (enoxaparin) around procedure.  I will route this recommendation to the requesting party via Epic fax function and remove from pre-op pool.  Please call with questions.  Kathyrn Drown, NP 03/15/2020, 8:40 AM

## 2020-05-16 ENCOUNTER — Telehealth: Payer: Self-pay | Admitting: Cardiovascular Disease

## 2020-05-16 NOTE — Telephone Encounter (Signed)
Attempted to submit prior authorization, unable to do so as med is already approved 04/16/20 - 05/16/21. Called pt who states [employer] just called him about an approval as well.

## 2020-05-16 NOTE — Telephone Encounter (Signed)
Pt c/o medication issue:  1. Name of Medication: apixaban (ELIQUIS) 5 MG TABS tablet  2. How are you currently taking this medication (dosage and times per day)? 1 tablet twice a day  3. Are you having a reaction (difficulty breathing--STAT)? no  4. What is your medication issue? Patient states the medication needs a prior authorization.

## 2020-06-21 ENCOUNTER — Encounter: Payer: Self-pay | Admitting: Emergency Medicine

## 2020-06-22 ENCOUNTER — Ambulatory Visit
Admission: RE | Admit: 2020-06-22 | Discharge: 2020-06-22 | Disposition: A | Discharge status: Home / Self Care | Payer: Medicare Other | Attending: Gastroenterology | Admitting: Gastroenterology

## 2020-06-22 ENCOUNTER — Other Ambulatory Visit: Payer: Self-pay

## 2020-06-22 ENCOUNTER — Encounter
Admission: RE | Disposition: A | Discharge status: Home / Self Care | Payer: Self-pay | Source: Home / Self Care | Attending: Gastroenterology

## 2020-06-22 ENCOUNTER — Ambulatory Visit: Payer: Medicare Other | Admitting: Registered Nurse

## 2020-06-22 ENCOUNTER — Encounter: Payer: Self-pay | Admitting: *Deleted

## 2020-06-22 DIAGNOSIS — E7800 Pure hypercholesterolemia, unspecified: Secondary | ICD-10-CM | POA: Diagnosis not present

## 2020-06-22 DIAGNOSIS — K635 Polyp of colon: Secondary | ICD-10-CM | POA: Insufficient documentation

## 2020-06-22 DIAGNOSIS — R194 Change in bowel habit: Secondary | ICD-10-CM | POA: Diagnosis not present

## 2020-06-22 DIAGNOSIS — Z7901 Long term (current) use of anticoagulants: Secondary | ICD-10-CM | POA: Diagnosis not present

## 2020-06-22 DIAGNOSIS — I4811 Longstanding persistent atrial fibrillation: Secondary | ICD-10-CM | POA: Insufficient documentation

## 2020-06-22 DIAGNOSIS — Z6841 Body Mass Index (BMI) 40.0 and over, adult: Secondary | ICD-10-CM | POA: Diagnosis not present

## 2020-06-22 DIAGNOSIS — I340 Nonrheumatic mitral (valve) insufficiency: Secondary | ICD-10-CM | POA: Diagnosis not present

## 2020-06-22 DIAGNOSIS — Z8719 Personal history of other diseases of the digestive system: Secondary | ICD-10-CM | POA: Insufficient documentation

## 2020-06-22 DIAGNOSIS — I739 Peripheral vascular disease, unspecified: Secondary | ICD-10-CM | POA: Diagnosis not present

## 2020-06-22 DIAGNOSIS — Z79899 Other long term (current) drug therapy: Secondary | ICD-10-CM | POA: Diagnosis not present

## 2020-06-22 DIAGNOSIS — K640 First degree hemorrhoids: Secondary | ICD-10-CM | POA: Diagnosis not present

## 2020-06-22 DIAGNOSIS — K5730 Diverticulosis of large intestine without perforation or abscess without bleeding: Secondary | ICD-10-CM | POA: Insufficient documentation

## 2020-06-22 DIAGNOSIS — K5520 Angiodysplasia of colon without hemorrhage: Secondary | ICD-10-CM | POA: Insufficient documentation

## 2020-06-22 DIAGNOSIS — E669 Obesity, unspecified: Secondary | ICD-10-CM | POA: Diagnosis not present

## 2020-06-22 HISTORY — PX: COLONOSCOPY WITH PROPOFOL: SHX5780

## 2020-06-22 SURGERY — COLONOSCOPY WITH PROPOFOL
Anesthesia: General

## 2020-06-22 MED ORDER — PROPOFOL 500 MG/50ML IV EMUL
INTRAVENOUS | Status: DC | PRN
  Administered 2020-06-22: 125 ug/kg/min via INTRAVENOUS

## 2020-06-22 MED ORDER — SODIUM CHLORIDE 0.9 % IV SOLN: INTRAVENOUS | Status: DC

## 2020-06-22 MED ORDER — LIDOCAINE HCL (CARDIAC) PF 100 MG/5ML IV SOSY
PREFILLED_SYRINGE | INTRAVENOUS | Status: DC | PRN
  Administered 2020-06-22: 80 mg via INTRAVENOUS

## 2020-06-22 MED ORDER — PROPOFOL 10 MG/ML IV BOLUS
INTRAVENOUS | Status: DC | PRN
  Administered 2020-06-22: 80 mg via INTRAVENOUS
  Administered 2020-06-22: 20 mg via INTRAVENOUS

## 2020-06-22 NOTE — Transfer of Care (Signed)
Immediate Anesthesia Transfer of Care Note  Patient: Louis Fuller  Procedure(s) Performed: COLONOSCOPY WITH PROPOFOL (N/A )  Patient Location: PACU  Anesthesia Type:MAC  Level of Consciousness: awake and patient cooperative  Airway & Oxygen Therapy: Patient Spontanous Breathing  Post-op Assessment: Report given to RN and Post -op Vital signs reviewed and stable  Post vital signs: Reviewed and stable  Last Vitals:  Vitals Value Taken Time  BP 92/55 06/22/20 1253  Temp 36.8 C 06/22/20 1251  Pulse 83 06/22/20 1253  Resp 15 06/22/20 1253  SpO2 93 % 06/22/20 1253    Last Pain:  Vitals:   06/22/20 1251  TempSrc: Temporal  PainSc: 0-No pain         Complications: No complications documented.

## 2020-06-22 NOTE — Interval H&P Note (Signed)
History and Physical Interval Note:  06/22/2020 12:15 PM  Louis Fuller  has presented today for surgery, with the diagnosis of CHANGE IN BH HX COLON POLYP.  The various methods of treatment have been discussed with the patient and family. After consideration of risks, benefits and other options for treatment, the patient has consented to  Procedure(s): COLONOSCOPY WITH PROPOFOL (N/A) as a surgical intervention.  The patient's history has been reviewed, patient examined, no change in status, stable for surgery.  I have reviewed the patient's chart and labs.  Questions were answered to the patient's satisfaction.     Lesly Rubenstein  Ok to proceed with colonoscopy

## 2020-06-22 NOTE — H&P (Signed)
Outpatient short stay form Pre-procedure 06/22/2020 12:12 PM Raylene Miyamoto MD, MPH  Primary Physician: Dr. Ailene Ravel  Reason for visit:  Loose stools  History of present illness:   75 y/o gentleman with history of obesity, alcohol use, a fib on NOAC with last dose being 3 days ago, and history of polyps here for colonoscopy for loose stool. No family history of GI malignancies. No abdominal surgeries.   No current facility-administered medications for this encounter.  Medications Prior to Admission  Medication Sig Dispense Refill Last Dose  . atorvastatin (LIPITOR) 40 MG tablet Take 1 tablet (40 mg total) by mouth daily. 90 tablet 3 06/22/2020 at Unknown time  . Carboxymethylcellul-Glycerin (REFRESH OPTIVE OP) Place 1 drop into both eyes daily as needed (for itchy eyes).    06/22/2020 at Unknown time  . desonide (DESOWEN) 0.05 % ointment Apply 1 application topically daily as needed. Apply to affected areas topically as needed.   Past Month at Unknown time  . metoprolol succinate (TOPROL-XL) 100 MG 24 hr tablet TAKE 1 TABLET DAILY. TAKE WITH OR IMMEDIATELY FOLLOWING A MEAL. 90 tablet 3 06/22/2020 at Unknown time  . Multiple Vitamin (MULTIVITAMIN) tablet Take 1 tablet by mouth daily.   Past Week at Unknown time  . omeprazole (PRILOSEC) 20 MG capsule Take 20 mg by mouth daily.   06/21/2020 at Unknown time  . apixaban (ELIQUIS) 5 MG TABS tablet Take 1 tablet (5 mg total) by mouth 2 (two) times daily. 180 tablet 3 06/19/2020  . sildenafil (REVATIO) 20 MG tablet Take 20 mg by mouth daily as needed for erectile dysfunction.        No Known Allergies   Past Medical History:  Diagnosis Date  . Arthritis   . Colon polyp   . Dysrhythmia   . ED (erectile dysfunction)   . Excessive drinking of alcohol   . Gastritis   . GERD (gastroesophageal reflux disease)   . Hypercholesterolemia   . 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 Longstanding persistent atrial fibrillation  ([place])   . Mitral regurgitation   . Obesity   . Peripheral vascular disease ([place])   . Varicose veins     Review of systems:  Otherwise negative.    Physical Exam  Gen: Alert, oriented. Appears stated age.  HEENT: PERRLA. Lungs: No respiratory distress CV: RRR Abd: soft, benign, no masses Ext: No edema    Planned procedures: Proceed with colonoscopy. The patient understands the nature of the planned procedure, indications, risks, alternatives and potential complications including but not limited to bleeding, infection, perforation, damage to internal organs and possible oversedation/side effects from anesthesia. The patient agrees and gives consent to proceed.  Please refer to procedure notes for findings, recommendations and patient disposition/instructions.     Raylene Miyamoto MD, MPH Gastroenterology 06/22/2020  12:12 PM

## 2020-06-22 NOTE — Anesthesia Postprocedure Evaluation (Signed)
Anesthesia Post Note  Patient: Louis Fuller  Procedure(s) Performed: COLONOSCOPY WITH PROPOFOL (N/A )  Patient location during evaluation: Phase II Anesthesia Type: General Level of consciousness: awake and alert Pain management: pain level controlled Vital Signs Assessment: post-procedure vital signs reviewed and stable Respiratory status: spontaneous breathing, nonlabored ventilation, respiratory function stable and patient connected to nasal cannula oxygen Cardiovascular status: blood pressure returned to baseline and stable Postop Assessment: no apparent nausea or vomiting Anesthetic complications: no   No complications documented.   Last Vitals:  Vitals:   06/22/20 1301 06/22/20 1311  BP: 112/74   Pulse: 87   Resp: 20 16  Temp:    SpO2: 98%     Last Pain:  Vitals:   06/22/20 1311  TempSrc:   PainSc: 0-No pain                 Phill Mutter

## 2020-06-22 NOTE — Op Note (Signed)
[facility] Patient Name: Louis Fuller Procedure Date: 06/22/2020 12:12 PM MRN: [id number] Account #: [id number] Date of Birth: 12-21-1945 Admit Type: Outpatient Age: 75 Room: [facility] ENDO ROOM 3 Gender: Male Note Status: Finalized Procedure:             Colonoscopy Indications:           Change in bowel habits Providers:             Andrey Farmer MD, MD Medicines:             Monitored Anesthesia Care Complications:         No immediate complications. Estimated blood loss:                         Minimal. Procedure:             Pre-Anesthesia Assessment:                        - Prior to the procedure, a History and Physical was                         performed, and patient medications and allergies were                         reviewed. The patient is competent. The risks and                         benefits of the procedure and the sedation options and                         risks were discussed with the patient. All questions                         were answered and informed consent was obtained.                         Patient identification and proposed procedure were                         verified by the physician, the nurse, the anesthetist                         and the technician in the endoscopy suite. Mental                         Status Examination: alert and oriented. Airway                         Examination: normal oropharyngeal airway and neck                         mobility. Respiratory Examination: clear to                         auscultation. CV Examination: normal. Prophylactic                         Antibiotics: The patient does not require prophylactic  antibiotics. Prior Anticoagulants: The patient has                         taken Eliquis (apixaban), last dose was 3 days prior                         to procedure. ASA Grade Assessment: III - A patient                         with  severe systemic disease. After reviewing the                         risks and benefits, the patient was deemed in                         satisfactory condition to undergo the procedure. The                         anesthesia plan was to use monitored anesthesia care                         (MAC). Immediately prior to administration of                         medications, the patient was re-assessed for adequacy                         to receive sedatives. The heart rate, respiratory                         rate, oxygen saturations, blood pressure, adequacy of                         pulmonary ventilation, and response to care were                         monitored throughout the procedure. The physical                         status of the patient was re-assessed after the                         procedure.                        After obtaining informed consent, the colonoscope was                         passed under direct vision. Throughout the procedure,                         the patient's blood pressure, pulse, and oxygen                         saturations were monitored continuously. The                         Colonoscope was introduced through the anus and  advanced to the the cecum, identified by appendiceal                         orifice and ileocecal valve. The colonoscopy was                         performed without difficulty. The patient tolerated                         the procedure well. The quality of the bowel                         preparation was good. Findings:      The perianal and digital rectal examinations were normal.      A single large angiodysplastic lesion without bleeding was found in the       cecum.      A tattoo was seen in the transverse colon. Small amount of erythematous       tissue. The polyp was removed with a cold snare. Resection and retrieval       were complete. Estimated blood loss was minimal.      Biopsies  for histology were taken with a cold forceps from the entire       colon for evaluation of microscopic colitis.      Scattered small-mouthed diverticula were found in the sigmoid colon.      Two hyperplastic polyps were found in the recto-sigmoid colon. The       polyps were 1 mm in size. These polyps were removed with a jumbo cold       forceps. Resection and retrieval were complete. Estimated blood loss was       minimal.      Internal hemorrhoids were found during retroflexion. The hemorrhoids       were Grade I (internal hemorrhoids that do not prolapse).      The exam was otherwise without abnormality on direct and retroflexion       views. Impression:            - A single non-bleeding colonic angiodysplastic lesion.                        - A tattoo was seen in the transverse colon. Small                         amount of erythematous tissue.                        - Diverticulosis in the sigmoid colon.                        - Two 1 mm polyps at the recto-sigmoid colon, removed                         with a jumbo cold forceps. Resected and retrieved.                        - Internal hemorrhoids.                        - The examination was otherwise normal on direct and  retroflexion views.                        - Biopsies were taken with a cold forceps from the                         entire colon for evaluation of microscopic colitis. Recommendation:        - Discharge patient to home.                        - Resume previous diet.                        - Continue present medications.                        - Await pathology results.                        - Repeat colonoscopy for surveillance based on                         pathology results.                        - Return to referring physician as previously                         scheduled.                        - Resume Eliquis (apixaban) at prior dose today. Procedure Code(s):     ---  Professional ---                        [id number], Colonoscopy, flexible; with removal of                         tumor(s), polyp(s), or other lesion(s) by snare                         technique                        45380, 23, Colonoscopy, flexible; with biopsy, single                         or multiple Diagnosis Code(s):     --- Professional ---                        K55.20, Angiodysplasia of colon without hemorrhage                        K64.0, First degree hemorrhoids                        K63.5, Polyp of colon                        R19.4, Change in bowel habit                        K57.30, Diverticulosis of large  intestine without                         perforation or abscess without bleeding CPT copyright 2019 American Medical Association. All rights reserved. The codes documented in this report are preliminary and upon coder review may  be revised to meet current compliance requirements. Andrey Farmer MD, MD 06/22/2020 12:52:34 PM Number of Addenda: 0 Note Initiated On: 06/22/2020 12:12 PM Scope Withdrawal Time: 0 hours 12 minutes 13 seconds  Total Procedure Duration: 0 hours 16 minutes 46 seconds  Estimated Blood Loss:  Estimated blood loss was minimal.      [facility]

## 2020-06-22 NOTE — Anesthesia Preprocedure Evaluation (Signed)
Anesthesia Evaluation  Patient identified by MRN, date of birth, ID band Patient awake    Reviewed: Allergy & Precautions, NPO status , Patient's Chart, lab work & pertinent test results  History of Anesthesia Complications Negative for: history of anesthetic complications  Airway Mallampati: II  TM Distance: >3 FB Neck ROM: Full    Dental no notable dental hx. (+) Poor Dentition   Pulmonary neg sleep apnea, neg COPD, Not current smoker,    Pulmonary exam normal        Cardiovascular hypertension, Pt. on medications and Pt. on home beta blockers + Peripheral Vascular Disease  Normal cardiovascular exam+ dysrhythmias Atrial Fibrillation      Neuro/Psych neg Seizures    GI/Hepatic Neg liver ROS, GERD  Medicated and Controlled,  Endo/Other  neg diabetes  Renal/GU negative Renal ROS     Musculoskeletal  (+) Arthritis , Osteoarthritis,    Abdominal   Peds  Hematology   Anesthesia Other Findings Arthritis    Colon polyp    Dysrhythmia    ED (erectile dysfunction)    Excessive drinking of alcohol    Gastritis    GERD (gastroesophageal reflux disease)    Hypercholesterolemia    Hypogonadism in male  consistent with testicular failure  Longstanding persistent atrial fibrillation ([facility])    Mitral regurgitation    Obesity    Peripheral vascular disease ([facility])    Varicose veins       Reproductive/Obstetrics                             Anesthesia Physical  Anesthesia Plan  ASA: III  Anesthesia Plan: General   Post-op Pain Management:    Induction: Intravenous  PONV Risk Score and Plan: Propofol infusion and TIVA  Airway Management Planned: Natural Airway and Nasal Cannula  Additional Equipment:   Intra-op Plan:   Post-operative Plan:   Informed Consent: I have reviewed the patients History and Physical, chart, labs and discussed the procedure including the risks, benefits and  alternatives for the proposed anesthesia with the patient or authorized representative who has indicated his/her understanding and acceptance.       Plan Discussed with:   Anesthesia Plan Comments:         Anesthesia Quick Evaluation

## 2020-06-25 ENCOUNTER — Encounter: Payer: Self-pay | Admitting: Gastroenterology

## 2020-06-26 LAB — SURGICAL PATHOLOGY

## 2020-09-13 DIAGNOSIS — H3530 Unspecified macular degeneration: Secondary | ICD-10-CM | POA: Insufficient documentation

## 2020-11-21 ENCOUNTER — Other Ambulatory Visit: Payer: Self-pay

## 2020-11-21 ENCOUNTER — Encounter (INDEPENDENT_AMBULATORY_CARE_PROVIDER_SITE_OTHER): Payer: Medicare Other | Admitting: Ophthalmology

## 2020-11-21 DIAGNOSIS — H35342 Macular cyst, hole, or pseudohole, left eye: Secondary | ICD-10-CM | POA: Diagnosis not present

## 2020-11-21 DIAGNOSIS — H35372 Puckering of macula, left eye: Secondary | ICD-10-CM | POA: Diagnosis not present

## 2020-11-21 DIAGNOSIS — H43813 Vitreous degeneration, bilateral: Secondary | ICD-10-CM | POA: Diagnosis not present

## 2020-11-26 ENCOUNTER — Encounter (INDEPENDENT_AMBULATORY_CARE_PROVIDER_SITE_OTHER): Payer: Medicare Other | Admitting: Ophthalmology

## 2020-12-10 ENCOUNTER — Other Ambulatory Visit: Payer: Self-pay | Admitting: Cardiovascular Disease

## 2020-12-10 NOTE — Telephone Encounter (Signed)
Eliquis 5mg  refill request received. Patient is 75 years old, weight-122.9kg, Crea-0.90 on 09/13/2020 via [place] from [place], and last seen by Dr. Acie Fredrickson on 12/16/2019-PT NEEDS AN APPT. Dose is appropriate based on dosing criteria.   PT NEEDS AN APPT WITH CARDIOLOGIST; WILL SEND A MESSAGE TO THE SCHEDULERS.   Pt has an appt on 02/09/2021 (first available) with Dr. Acie Fredrickson. Will send in the refill to requested pharmacy.

## 2020-12-11 ENCOUNTER — Telehealth: Payer: Self-pay | Admitting: *Deleted

## 2020-12-11 NOTE — Telephone Encounter (Signed)
   [place] Pre-operative Risk Assessment    Patient Name: Louis Fuller  DOB: [birth year] MRN: [id number]  HEARTCARE STAFF:  - IMPORTANT!!!!!! Under Visit Info/Reason for Call, type in Other and utilize the format Clearance MM/DD/YY or Clearance TBD. Do not use dashes or single digits. - Please review there is not already an duplicate clearance open for this procedure. - If request is for dental extraction, please clarify the # of teeth to be extracted. - If the patient is currently at the dentist's office, call Pre-Op Callback Staff (MA/nurse) to input urgent request.  - If the patient is not currently in the dentist office, please route to the Pre-Op pool.  Request for surgical clearance:  What type of surgery is being performed?  LEFT TOTAL KNEE REPLACEMENT   When is this surgery scheduled?  TBD  What type of clearance is required (medical clearance vs. Pharmacy clearance to hold med vs. Both)?  BOTH  Are there any medications that need to be held prior to surgery and how long?  [place] name and name of physician performing surgery?  MURPHY WAINER / DR. [place]  What is the office phone number?  [phone]   7.   What is the office fax number?  [phone]  8.   Anesthesia type (None, local, MAC, general) ?  SPINAL ANESTHESIA   Jeanann Lewandowsky 12/11/2020, 4:44 PM  _________________________________________________________________   (provider comments below)

## 2020-12-11 NOTE — Telephone Encounter (Signed)
   Name: Louis Fuller  DOB: 30-Jan-1945  MRN: [id number]  Primary Cardiologist: Mertie Moores, MD  Chart reviewed as part of pre-operative protocol coverage. Pt last seen by Dr. Acie Fredrickson 12/16/2019.  Because of Louis Fuller's past medical history and time since last visit, he will require a follow-up visit in order to better assess preoperative cardiovascular risk.  Pre-op covering staff: - Please schedule appointment and call patient to inform them. If patient already had an upcoming appointment within acceptable timeframe, please add "pre-op clearance" to the appointment notes so provider is aware. - Please contact requesting surgeon's office via preferred method (i.e, phone, fax) to inform them of need for appointment prior to surgery.  Note will also be routed to PharmD for recommendations regarding holding anticoagulation.    Richardson Dopp, PA-C  12/11/2020, 9:45 PM

## 2020-12-12 ENCOUNTER — Telehealth: Payer: Self-pay | Admitting: *Deleted

## 2020-12-12 NOTE — Telephone Encounter (Signed)
Call transferred from pre-op team to discuss current pt findings of edema. Pt reports BLEE, chronic, right greater than left.  States ortho was little concerned d/t slight weeping from right leg. This is not new for the pt. Denies CP, SOB, wt gain. Pt is going to follow up with vascular first about his legs. He will call back if they feel cardiology related swelling and we need to see him. Pt appreciates taking time to speak with him.

## 2020-12-12 NOTE — Telephone Encounter (Signed)
Patient with diagnosis of A Fib on Eliquis for anticoagulation.    Procedure: LEFT TOTAL KNEE REPLACEMENT  Date of procedure: TBD   CHA2DS2-VASc Score = 2  This indicates a 2.2% annual risk of stroke. The patient's score is based upon: CHF History: 0 HTN History: 1 Diabetes History: 0 Stroke History: 0 Vascular Disease History: 0 Age Score: 1 Gender Score: 0   CrCl 96 mL/min using adjusted body weight Platelet count 160K  Per office protocol, patient can hold Eliquis for 3 days prior to procedure.   [name] Kitchen

## 2020-12-12 NOTE — Telephone Encounter (Signed)
Pt is scheduled to see Dr. Acie Fredrickson, 02/12/21, and per pt, will wait until that appointment to get surgical clearance.  I will let the requesting surgeon's office know.

## 2021-01-07 ENCOUNTER — Ambulatory Visit (INDEPENDENT_AMBULATORY_CARE_PROVIDER_SITE_OTHER): Payer: Medicare Other | Admitting: Vascular Surgery

## 2021-01-07 ENCOUNTER — Other Ambulatory Visit: Payer: Self-pay

## 2021-01-07 ENCOUNTER — Encounter (INDEPENDENT_AMBULATORY_CARE_PROVIDER_SITE_OTHER): Payer: Self-pay | Admitting: Vascular Surgery

## 2021-01-07 VITALS — BP 142/91 | HR 97 | Resp 16 | Ht 70.0 in | Wt 278.8 lb

## 2021-01-07 DIAGNOSIS — I890 Lymphedema, not elsewhere classified: Secondary | ICD-10-CM | POA: Diagnosis not present

## 2021-01-07 DIAGNOSIS — I872 Venous insufficiency (chronic) (peripheral): Secondary | ICD-10-CM | POA: Diagnosis not present

## 2021-01-07 DIAGNOSIS — I482 Chronic atrial fibrillation, unspecified: Secondary | ICD-10-CM

## 2021-01-07 DIAGNOSIS — K219 Gastro-esophageal reflux disease without esophagitis: Secondary | ICD-10-CM | POA: Diagnosis not present

## 2021-01-07 DIAGNOSIS — E782 Mixed hyperlipidemia: Secondary | ICD-10-CM

## 2021-01-09 ENCOUNTER — Other Ambulatory Visit: Payer: Self-pay | Admitting: Cardiovascular Disease

## 2021-01-12 ENCOUNTER — Encounter (INDEPENDENT_AMBULATORY_CARE_PROVIDER_SITE_OTHER): Payer: Self-pay | Admitting: Vascular Surgery

## 2021-01-12 DIAGNOSIS — I890 Lymphedema, not elsewhere classified: Secondary | ICD-10-CM | POA: Insufficient documentation

## 2021-01-12 NOTE — Progress Notes (Signed)
MRN : [id number]  Louis Fuller is a 75 y.o. (November 19, 1945) male who presents with chief complaint of leg swelling.  History of Present Illness:   Patient is seen for evaluation of leg pain and leg swelling. The patient first noticed the swelling remotely. The swelling is associated with pain and discoloration. The pain and swelling worsens with prolonged dependency and improves with elevation. The pain is unrelated to activity.  The patient notes that in the morning the legs are significantly improved but they steadily worsened throughout the course of the day. The patient also notes a steady worsening of the discoloration in the ankle and shin area.   The patient denies claudication symptoms.  The patient denies symptoms consistent with rest pain.  The patient denies and extensive history of DJD and LS spine disease.  The patient has no had any past angiography, interventions or vascular surgery.  Elevation makes the leg symptoms better, dependency makes them much worse. There is no history of ulcerations. The patient denies any recent changes in medications.  The patient has not been wearing graduated compression.  The patient denies a history of DVT or PE. There is no prior history of phlebitis. There is no history of primary lymphedema.  No history of malignancies. No history of trauma or groin or pelvic surgery. There is no history of radiation treatment to the groin or pelvis  The patient denies amaurosis fugax or recent TIA symptoms. There are no recent neurological changes noted. The patient denies recent episodes of angina or shortness of breath   Current Meds  Medication Sig   apixaban (ELIQUIS) 5 MG TABS tablet TAKE 1 TABLET TWICE A DAY   atorvastatin (LIPITOR) 40 MG tablet Take 1 tablet (40 mg total) by mouth daily.   Carboxymethylcellul-Glycerin (REFRESH OPTIVE OP) Place 1 drop into both eyes daily as needed (for itchy eyes).    desonide (DESOWEN) 0.05 % ointment  Apply 1 application topically daily as needed. Apply to affected areas topically as needed.   Multiple Vitamin (MULTIVITAMIN) tablet Take 1 tablet by mouth daily.   omeprazole (PRILOSEC) 40 MG capsule Take 40 mg by mouth daily.   sildenafil (REVATIO) 20 MG tablet Take 20 mg by mouth daily as needed for erectile dysfunction.   [DISCONTINUED] metoprolol succinate (TOPROL-XL) 100 MG 24 hr tablet TAKE 1 TABLET DAILY. TAKE WITH OR IMMEDIATELY FOLLOWING A MEAL.    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   Longstanding persistent atrial fibrillation (HCC)    Mitral regurgitation    Obesity    Peripheral vascular disease ([place])    Varicose veins     Past Surgical History:  Procedure Laterality Date   CARDIOVERSION N/A 10/06/2013   Procedure: CARDIOVERSION;  Surgeon: Lelon Perla, MD;  Location: [facility];  Service: Cardiovascular;  Laterality: N/A;   CATARACT EXTRACTION     left   CATARACT EXTRACTION     COLONOSCOPY     COLONOSCOPY WITH PROPOFOL N/A 12/26/2014   Procedure: COLONOSCOPY WITH PROPOFOL;  Surgeon: Lollie Sails, MD;  Location: [facility] ENDOSCOPY;  Service: Endoscopy;  Laterality: N/A;   COLONOSCOPY WITH PROPOFOL N/A 03/27/2015   Procedure: COLONOSCOPY WITH PROPOFOL;  Surgeon: Lollie Sails, MD;  Location: [facility] ENDOSCOPY;  Service: Endoscopy;  Laterality: N/A;   COLONOSCOPY WITH PROPOFOL N/A 10/22/2016   Procedure: COLONOSCOPY WITH PROPOFOL;  Surgeon: Toledo, Benay Pike, MD;  Location: [facility] ENDOSCOPY;  Service: Endoscopy;  Laterality: N/A;   COLONOSCOPY WITH PROPOFOL N/A 06/22/2020   Procedure: COLONOSCOPY WITH PROPOFOL;  Surgeon: Lesly Rubenstein, MD;  Location: [facility] ENDOSCOPY;  Service: Endoscopy;  Laterality: N/A;    ENDOVENOUS ABLATION SAPHENOUS VEIN W/ LASER     ESOPHAGOGASTRODUODENOSCOPY (EGD) WITH PROPOFOL N/A 12/26/2014   Procedure: ESOPHAGOGASTRODUODENOSCOPY (EGD) WITH PROPOFOL;  Surgeon: Lollie Sails, MD;  Location: [facility] ENDOSCOPY;  Service: Endoscopy;  Laterality: N/A;   EYE SURGERY     HERNIA REPAIR     vein laser surgery      Social History Social History   Tobacco Use   Smoking status: Never   Smokeless tobacco: Never  Vaping Use   Vaping Use: Never used  Substance Use Topics   Alcohol use: Yes    Alcohol/week: 14.0 standard drinks    Types: 14 Cans of beer per week    Comment: none last 24hrs   Drug use: No    Family History Family History  Family history unknown: Yes    No Known Allergies   REVIEW OF SYSTEMS (Negative unless checked)  Constitutional: [] Weight loss  [] Fever  [] Chills Cardiac: [] Chest pain   [] Chest pressure   [] Palpitations   [] Shortness of breath when laying flat   [] Shortness of breath with exertion. Vascular:  [] Pain in legs with walking   [x] Pain in legs at rest  [] History of DVT   [] Phlebitis   [x] Swelling in legs   [] Varicose veins   [] Non-healing ulcers Pulmonary:   [] Uses home oxygen   [] Productive cough   [] Hemoptysis   [] Wheeze  [] COPD   [] Asthma Neurologic:  [] Dizziness   [] Seizures   [] History of stroke   [] History of TIA  [] Aphasia   [] Vissual changes   [] Weakness or numbness in arm   [] Weakness or numbness in leg Musculoskeletal:   [] Joint swelling   [] Joint pain   [] Low back pain Hematologic:  [] Easy bruising  [] Easy bleeding   [] Hypercoagulable state   [] Anemic Gastrointestinal:  [] Diarrhea   [] Vomiting  [x] Gastroesophageal reflux/heartburn   [] Difficulty swallowing. Genitourinary:  [] Chronic kidney disease   [] Difficult urination  [] Frequent urination   [] Blood in urine Skin:  [] Rashes   [] Ulcers  Psychological:  [] History of anxiety   []  History of major depression.  Physical Examination  Vitals:   01/07/21 1124  BP: (!) 142/91   Pulse: 97  Resp: 16  Weight: 278 lb 12.8 oz (126.5 kg)  Height: 5\' 10"  (1.778 m)   Body mass index is 40 kg/m. Gen: WD/WN, NAD Head: [place]/AT, No temporalis wasting.  Ear/Nose/Throat: Hearing grossly intact, nares w/o erythema or drainage, pinna without lesions Eyes: PER, EOMI, sclera nonicteric.  Neck: Supple, no gross masses.  No JVD.  Pulmonary:  Good air movement, no audible wheezing, no use of accessory muscles.  Cardiac: RRR, precordium not hyperdynamic. Vascular:  scattered varicosities present bilaterally.  moderate venous stasis changes to the legs bilaterally.  3-4+ soft pitting edema  Vessel Right Left  Radial Palpable Palpable  Gastrointestinal: soft, non-distended. No guarding/no peritoneal signs.  Musculoskeletal: M/S 5/5 throughout.  No deformity.  Neurologic: CN 2-12 intact. Pain and light touch intact in extremities.  Symmetrical.  Speech is fluent. Motor exam as listed above. Psychiatric: Judgment intact, Mood & affect appropriate  for pt's clinical situation. Dermatologic: Moderate venous rashes no ulcers noted.  No changes consistent with cellulitis. Lymph : No lichenification or skin changes of chronic lymphedema.  CBC Lab Results  Component Value Date   WBC 8.6 03/27/2015   HGB 14.2 03/27/2015   HCT 41.8 03/27/2015   MCV 92.9 03/27/2015   PLT 200 03/27/2015    BMET    Component Value Date/Time   NA 142 12/06/2018 1150   K 5.1 12/06/2018 1150   CL 102 12/06/2018 1150   CO2 24 12/06/2018 1150   GLUCOSE 84 12/06/2018 1150   GLUCOSE 94 03/13/2015 1152   BUN 12 12/06/2018 1150   CREATININE 0.91 12/06/2018 1150   CREATININE 0.93 03/13/2015 1152   CALCIUM 9.2 12/06/2018 1150   GFRNONAA 85 12/06/2018 1150   GFRAA 98 12/06/2018 1150   CrCl cannot be calculated (Patient's most recent lab result is older than the maximum 21 days allowed.).  COAG Lab Results  Component Value Date   INR 1.03 03/27/2015   INR 1.0 09/27/2013    Radiology No results  found.   Assessment/Plan 1. Lymphedema I have had a long discussion with the patient regarding swelling and why it  causes symptoms.  Patient will begin wearing graduated compression stockings class 1 (20-30 mmHg) on a daily basis a prescription was given. The patient will  beginning wearing the stockings first thing in the morning and removing them in the evening. The patient is instructed specifically not to sleep in the stockings.   In addition, behavioral modification will be initiated.  This will include frequent elevation, use of over the counter pain medications and exercise such as walking.  I have reviewed systemic causes for chronic edema such as liver, kidney and cardiac etiologies.  The patient denies problems with these organ systems.    Consideration for a lymph pump will also be made based upon the effectiveness of conservative therapy.  This would help to improve the edema control and prevent sequela such as ulcers and infections   Patient should undergo duplex ultrasound of the venous system to ensure that DVT or reflux is not present.  The patient will follow-up with me after the ultrasound.   - VAS [place] LOWER EXTREMITY VENOUS REFLUX; Future  2. Venous insufficiency I have had a long discussion with the patient regarding swelling and why it  causes symptoms.  Patient will begin wearing graduated compression stockings class 1 (20-30 mmHg) on a daily basis a prescription was given. The patient will  beginning wearing the stockings first thing in the morning and removing them in the evening. The patient is instructed specifically not to sleep in the stockings.   In addition, behavioral modification will be initiated.  This will include frequent elevation, use of over the counter pain medications and exercise such as walking.  I have reviewed systemic causes for chronic edema such as liver, kidney and cardiac etiologies.  The patient denies problems with these organ systems.     Consideration for a lymph pump will also be made based upon the effectiveness of conservative therapy.  This would help to improve the edema control and prevent sequela such as ulcers and infections   Patient should undergo duplex ultrasound of the venous system to ensure that DVT or reflux is not present.  The patient will follow-up with me after the ultrasound.   - VAS [place] LOWER EXTREMITY VENOUS REFLUX; Future  3. Gastroesophageal reflux disease without esophagitis Continue PPI as already ordered, this  medication has been reviewed and there are no changes at this time.  Avoidence of caffeine and alcohol  Moderate elevation of the head of the bed    4. Chronic atrial fibrillation (HCC) Continue antiarrhythmia medications as already ordered, these medications have been reviewed and there are no changes at this time.  Continue anticoagulation as ordered by Cardiology Service   5. Mixed hyperlipidemia Continue statin as ordered and reviewed, no changes at this time     Hortencia Pilar, MD  01/12/2021 12:40 PM

## 2021-01-30 ENCOUNTER — Other Ambulatory Visit: Payer: Self-pay | Admitting: Cardiovascular Disease

## 2021-02-10 NOTE — Progress Notes (Signed)
MRN : [id number]  Louis Fuller is a 76 y.o. ([birth year]) male who presents with chief complaint of check leg swelling.  History of Present Illness:   The patient returns to the office for followup evaluation regarding leg swelling.  The swelling has improved quite a bit and the pain associated with swelling has decreased substantially. There have not been any interval development of a ulcerations or wounds.  Currently he is not weeping or leaking any lymph fluid.  Since the previous visit the patient has been wearing graduated compression stockings and has noted little significant improvement in the lymphedema. The patient has been using compression routinely morning until night.  The patient also states elevation during the day and exercise is being done too.     No outpatient medications have been marked as taking for the 02/11/21 encounter (Appointment) with Delana Meyer, Dolores Lory, MD.    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   Longstanding persistent atrial fibrillation (HCC)    Mitral regurgitation    Obesity    Peripheral vascular disease ([place])    Varicose veins     Past Surgical History:  Procedure Laterality Date   CARDIOVERSION N/A 10/06/2013   Procedure: CARDIOVERSION;  Surgeon: Lelon Perla, MD;  Location: [facility];  Service: Cardiovascular;  Laterality: N/A;   CATARACT EXTRACTION     left   CATARACT EXTRACTION     COLONOSCOPY     COLONOSCOPY WITH PROPOFOL N/A 12/26/2014   Procedure: COLONOSCOPY WITH PROPOFOL;  Surgeon: Lollie Sails, MD;  Location: [facility] ENDOSCOPY;  Service: Endoscopy;  Laterality: N/A;   COLONOSCOPY WITH PROPOFOL N/A 03/27/2015   Procedure: COLONOSCOPY WITH PROPOFOL;  Surgeon: Lollie Sails, MD;  Location: [facility] ENDOSCOPY;  Service: Endoscopy;  Laterality: N/A;   COLONOSCOPY WITH PROPOFOL N/A 10/22/2016   Procedure: COLONOSCOPY WITH PROPOFOL;  Surgeon: Toledo, Benay Pike, MD;  Location: [facility] ENDOSCOPY;  Service: Endoscopy;  Laterality: N/A;   COLONOSCOPY WITH PROPOFOL N/A 06/22/2020   Procedure: COLONOSCOPY WITH PROPOFOL;  Surgeon: Lesly Rubenstein, MD;  Location: [facility] ENDOSCOPY;  Service: Endoscopy;  Laterality: N/A;   ENDOVENOUS ABLATION SAPHENOUS VEIN W/ LASER     ESOPHAGOGASTRODUODENOSCOPY (EGD) WITH PROPOFOL N/A 12/26/2014   Procedure: ESOPHAGOGASTRODUODENOSCOPY (EGD) WITH PROPOFOL;  Surgeon: Lollie Sails, MD;  Location: [facility] ENDOSCOPY;  Service: Endoscopy;  Laterality: N/A;   EYE SURGERY     HERNIA REPAIR     vein laser surgery      Social History Social History   Tobacco Use   Smoking status: Never   Smokeless tobacco: Never  Vaping Use   Vaping Use: Never used  Substance Use Topics   Alcohol use: Yes    Alcohol/week: 14.0 standard drinks    Types: 14 Cans of beer per week    Comment: none last 24hrs   Drug use: No    Family History Family History  Family history unknown: Yes    No Known Allergies   REVIEW OF SYSTEMS (Negative unless checked)  Constitutional: [] Weight loss  [] Fever  [] Chills Cardiac: [] Chest pain   [] Chest pressure   [] Palpitations   [] Shortness of breath when laying flat   [] Shortness of breath  with exertion. Vascular:  [] Pain in legs with walking   [x] Pain in legs at rest  [] History of DVT   [] Phlebitis   [x] Swelling in legs   [x] Varicose veins   [] Non-healing ulcers Pulmonary:   [] Uses home oxygen   [] Productive cough   [] Hemoptysis   [] Wheeze  [] COPD   [] Asthma Neurologic:  [] Dizziness   [] Seizures   [] History of stroke   [] History of TIA  [] Aphasia   [] Vissual changes   [] Weakness or numbness in arm   [] Weakness or numbness in leg Musculoskeletal:   [x] Joint swelling   [x] Joint pain   [] Low back pain Hematologic:   [] Easy bruising  [] Easy bleeding   [] Hypercoagulable state   [] Anemic Gastrointestinal:  [] Diarrhea   [] Vomiting  [x] Gastroesophageal reflux/heartburn   [] Difficulty swallowing. Genitourinary:  [] Chronic kidney disease   [] Difficult urination  [] Frequent urination   [] Blood in urine Skin:  [] Rashes   [] Ulcers  Psychological:  [] History of anxiety   []  History of major depression.  Physical Examination  There were no vitals filed for this visit. There is no height or weight on file to calculate BMI. Gen: WD/WN, NAD Head: [place]/AT, No temporalis wasting.  Ear/Nose/Throat: Hearing grossly intact, nares w/o erythema or drainage, pinna without lesions Eyes: PER, EOMI, sclera nonicteric.  Neck: Supple, no gross masses.  No JVD.  Pulmonary:  Good air movement, no audible wheezing, no use of accessory muscles.  Cardiac: RRR, precordium not hyperdynamic. Vascular:  scattered varicosities present bilaterally.  severe venous stasis changes to the legs bilaterally.  3+ firm pitting edema  Vessel Right Left  Radial Palpable Palpable  Gastrointestinal: soft, non-distended. No guarding/no peritoneal signs.  Musculoskeletal: M/S 5/5 throughout.  No deformity.  Neurologic: CN 2-12 intact. Pain and light touch intact in extremities.  Symmetrical.  Speech is fluent. Motor exam as listed above. Psychiatric: Judgment intact, Mood & affect appropriate for pt's clinical situation. Dermatologic: Severe venous rashes no ulcers noted.  No changes consistent with cellulitis. Lymph : No lichenification or skin changes of chronic lymphedema.  CBC Lab Results  Component Value Date   WBC 8.6 03/27/2015   HGB 14.2 03/27/2015   HCT 41.8 03/27/2015   MCV 92.9 03/27/2015   PLT 200 03/27/2015    BMET    Component Value Date/Time   NA 142 12/06/2018 1150   K 5.1 12/06/2018 1150   CL 102 12/06/2018 1150   CO2 24 12/06/2018 1150   GLUCOSE 84 12/06/2018 1150   GLUCOSE 94 03/13/2015 1152   BUN 12 12/06/2018 1150    CREATININE 0.91 12/06/2018 1150   CREATININE 0.93 03/13/2015 1152   CALCIUM 9.2 12/06/2018 1150   GFRNONAA 85 12/06/2018 1150   GFRAA 98 12/06/2018 1150   CrCl cannot be calculated (Patient's most recent lab result is older than the maximum 21 days allowed.).  COAG Lab Results  Component Value Date   INR 1.03 03/27/2015   INR 1.0 09/27/2013    Radiology No results found.   Assessment/Plan 1. Varicose veins with pain  No surgery or intervention at this point in time.    He is cleared for surgery per vascular  I have reviewed my discussion with the patient regarding lymphedema and why it  causes symptoms.  Patient will continue wearing graduated compression stockings class 1 (20-30 mmHg) on a daily basis a prescription was given. The patient is reminded to put the stockings on first thing in the morning and removing them in the evening. The patient is instructed specifically  not to sleep in the stockings.   In addition, behavioral modification throughout the day will be continued.  This will include frequent elevation (such as in a recliner), use of over the counter pain medications as needed and exercise such as walking.  I have reviewed systemic causes for chronic edema such as liver, kidney and cardiac etiologies and there does not appear to be any significant changes in these organ systems over the past year.  The patient is under the impression that these organ systems are all stable and unchanged.    The patient will continue aggressive use of the  lymph pump.  This will continue to improve the edema control and prevent sequela such as ulcers and infections.   The patient will follow-up with me on an annual basis.    2. Venous insufficiency  No surgery or intervention at this point in time.    I have reviewed my discussion with the patient regarding lymphedema and why it  causes symptoms.  Patient will continue wearing graduated compression stockings class 1 (20-30 mmHg)  on a daily basis a prescription was given. The patient is reminded to put the stockings on first thing in the morning and removing them in the evening. The patient is instructed specifically not to sleep in the stockings.   In addition, behavioral modification throughout the day will be continued.  This will include frequent elevation (such as in a recliner), use of over the counter pain medications as needed and exercise such as walking.  I have reviewed systemic causes for chronic edema such as liver, kidney and cardiac etiologies and there does not appear to be any significant changes in these organ systems over the past year.  The patient is under the impression that these organ systems are all stable and unchanged.    The patient will continue aggressive use of the  lymph pump.  This will continue to improve the edema control and prevent sequela such as ulcers and infections.   The patient will follow-up with me on an annual basis.    3. Chronic atrial fibrillation (HCC) Continue antiarrhythmia medications as already ordered, these medications have been reviewed and there are no changes at this time.  Continue anticoagulation as ordered by Cardiology Service   4. Gastroesophageal reflux disease without esophagitis Continue PPI as already ordered, this medication has been reviewed and there are no changes at this time.  Avoidence of caffeine and alcohol  Moderate elevation of the head of the bed     Hortencia Pilar, MD  02/10/2021 1:19 PM

## 2021-02-11 ENCOUNTER — Ambulatory Visit (INDEPENDENT_AMBULATORY_CARE_PROVIDER_SITE_OTHER): Payer: Medicare Other | Admitting: Vascular Surgery

## 2021-02-11 ENCOUNTER — Encounter (INDEPENDENT_AMBULATORY_CARE_PROVIDER_SITE_OTHER): Payer: Self-pay | Admitting: Vascular Surgery

## 2021-02-11 ENCOUNTER — Other Ambulatory Visit: Payer: Self-pay

## 2021-02-11 ENCOUNTER — Ambulatory Visit (INDEPENDENT_AMBULATORY_CARE_PROVIDER_SITE_OTHER): Payer: Medicare Other

## 2021-02-11 ENCOUNTER — Encounter: Payer: Self-pay | Admitting: Cardiovascular Disease

## 2021-02-11 VITALS — BP 119/82 | HR 90 | Resp 16 | Wt 274.8 lb

## 2021-02-11 DIAGNOSIS — I83819 Varicose veins of unspecified lower extremities with pain: Secondary | ICD-10-CM | POA: Diagnosis not present

## 2021-02-11 DIAGNOSIS — I890 Lymphedema, not elsewhere classified: Secondary | ICD-10-CM | POA: Diagnosis not present

## 2021-02-11 DIAGNOSIS — I482 Chronic atrial fibrillation, unspecified: Secondary | ICD-10-CM

## 2021-02-11 DIAGNOSIS — I872 Venous insufficiency (chronic) (peripheral): Secondary | ICD-10-CM

## 2021-02-11 DIAGNOSIS — K219 Gastro-esophageal reflux disease without esophagitis: Secondary | ICD-10-CM

## 2021-02-11 NOTE — Progress Notes (Addendum)
Cardiology Office Note   Date:  02/12/2021   ID:  Louis Fuller, DOB [birth year], MRN [id number]  PCP:  Louis Morel, MD  Cardiologist:   Louis Moores, MD   Chief Complaint  Patient presents with   Atrial Fibrillation   1. Paroxysmal atrial fibrillation 2. Hypertension 3. Hyperlipidemia 4. Knee arthritis -       Louis Fuller is a 76 y.o. gentleman with a Hx of paroxysmal Atrial Fibrillation.  He notices it significant heart irregularities after he comes in from own grass or doing other yard work. He takes an extra half of of Toprol that seemed to help. He gets some extra exercise  March 04, 2012:  Louis Fuller is doing well.  He has some rare episodes of palpitations that clinically sound like atrial fib.  The episode lasted most of the night.   He takes propranolol on occasion for these episodes of A-Fib.  July 05, 2013:  He has moved to [place] since I last saw him.    He needs to have knee replacements.    He has vericose veins in his legs and has leg swelling.    No CP, no dyspnea.    He is in A-fib today at his office visit.   August 03, 2013:    He is still in atrial fib.  He is basically asymptomatic.  He's able to do all of his normal activities without any problems. He denies any chest pain or shortness of breath.  He has not had his myoview yet.    Echo revealed. Normal left ventricular systolic function and mild mitral regurgitation, trivial aortic insufficiency.    August 23, 2013: Louis Fuller is seen back - he is still A. symptomatically and basically cannot tell that is in atrial fibrillation. He remains in atrial fibrillation. He's able to do all his normal activities without any issues. He's tolerating the Flecainide well. He's on Eliquis and has not noted any bleeding.  Sept. 1, 2015: Louis Fuller is seen  - doing well with current meds.  Ready to have a cardioversion scheduled.  Nov. 3, 2015: Louis Fuller had a successful cardioversion about a month or so ago. Several days after  that he felt that his heart was beating correctly. He is seen today for followup and in fact is back in atrial fibrillation. He continues to be asymptomatic.     Jun 15, 2014: Louis Fuller is a 76 y.o. male who presents for follow-up of his atrial fibrillation. He has been seen by Dr. Rayann Heman for consideration for A. fib ablation. Given his lack of symptoms, A. fib ablation is not currently indicated. He declined one to start Tikosyn. His echocardiogram in June, 2015 showed normal left ventricle systolic function.  September 14, 2014:  Doing well.  No symptoms  Limited by knee pain .   Feb. 14, 2017: Doing well from a cardiac standpoint.   Lots of other issues  - was found to have a precancerous lesion on his colon.  Needs to have 4 teeth removed. Will hold his Eliquis for 2 days prior.   Wife, Louis Fuller had a mass removed from her head.   Might be an infection .   Sept. 18, 2017:  Doing well.   HR is well controlled.     All other issues are well controlled.   Has colon polyps ,  Gets colonoscopy yearly .   Sept. 11, 2018:  Wt is 249 today  Louis Fuller is seen for follow up of  his chronic Afib. Has mild LV dysfunction by echo in May 2016, mild MR  Has been doing ok Is having a colonocsopy Friday  Will take the last dose of Eliquis on Wednesday .   Sept. 9, 2019: Louis Fuller is seen for his chronic Afib. No cp or dyspnea.   Is not exercising as much as he should  Doing well .   We discussed weight .   Advised weight loss   Nov. 9, 2020  Louis Fuller is seen for pre op eval  For knee surgery .  Overall doing well.  Wife, Louis Fuller has has been busy taking care of her mother    12/16/2019: Louis Fuller has not had his knee surgery .  Wt. Is 265. Trying to lose weight .  Eating [place] bacon some  No cp, no dyspnea   Jan. 17, 2023  Louis Fuller is seen for follow up of his atrial fib Wt is 275 lbs  HR is a bit elevated,  its normal at home   Has leg edema .   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   Longstanding persistent atrial fibrillation (HCC)    Mitral regurgitation    Obesity    Peripheral vascular disease ([place])    Varicose veins     Past Surgical History:  Procedure Laterality Date   CARDIOVERSION N/A 10/06/2013   Procedure: CARDIOVERSION;  Surgeon: Lelon Perla, MD;  Location: [facility];  Service: Cardiovascular;  Laterality: N/A;   CATARACT EXTRACTION     left   CATARACT EXTRACTION     COLONOSCOPY     COLONOSCOPY WITH PROPOFOL N/A 12/26/2014   Procedure: COLONOSCOPY WITH PROPOFOL;  Surgeon: Lollie Sails, MD;  Location: [facility] ENDOSCOPY;  Service: Endoscopy;  Laterality: N/A;   COLONOSCOPY WITH PROPOFOL N/A 03/27/2015   Procedure: COLONOSCOPY WITH PROPOFOL;  Surgeon: Lollie Sails, MD;  Location: [facility] ENDOSCOPY;  Service: Endoscopy;  Laterality: N/A;   COLONOSCOPY WITH PROPOFOL N/A 10/22/2016   Procedure: COLONOSCOPY WITH PROPOFOL;  Surgeon: Toledo, Benay Pike, MD;  Location: [facility] ENDOSCOPY;  Service: Endoscopy;  Laterality: N/A;   COLONOSCOPY WITH PROPOFOL N/A 06/22/2020   Procedure: COLONOSCOPY WITH PROPOFOL;  Surgeon: Lesly Rubenstein, MD;  Location: [facility] ENDOSCOPY;  Service: Endoscopy;  Laterality: N/A;   ENDOVENOUS ABLATION SAPHENOUS VEIN W/ LASER     ESOPHAGOGASTRODUODENOSCOPY (EGD) WITH PROPOFOL N/A 12/26/2014   Procedure: ESOPHAGOGASTRODUODENOSCOPY (EGD) WITH PROPOFOL;  Surgeon: Lollie Sails, MD;  Location: [facility] ENDOSCOPY;  Service: Endoscopy;  Laterality: N/A;   EYE SURGERY     HERNIA REPAIR     vein laser surgery       Current Outpatient Medications  Medication Sig Dispense Refill   apixaban  (ELIQUIS) 5 MG TABS tablet TAKE 1 TABLET TWICE A DAY 180 tablet 1   atorvastatin (LIPITOR) 40 MG tablet Take 1 tablet (40 mg total) by mouth daily. Pt needs to keep upcoming appt in Jan, 2023 for further refills 30 tablet 0   Carboxymethylcellul-Glycerin (REFRESH OPTIVE OP) Place 1 drop into both eyes daily as needed (for itchy eyes).  desonide (DESOWEN) 0.05 % ointment Apply 1 application topically daily as needed. Apply to affected areas topically as needed.     metoprolol succinate (TOPROL-XL) 100 MG 24 hr tablet TAKE 1 TABLET DAILY, WITH OR IMMEDIATELY FOLLOWING A MEAL 90 tablet 0   Multiple Vitamin (MULTIVITAMIN) tablet Take 1 tablet by mouth daily.     omeprazole (PRILOSEC) 40 MG capsule Take 40 mg by mouth daily.     sildenafil (REVATIO) 20 MG tablet Take 20 mg by mouth daily as needed for erectile dysfunction.     No current facility-administered medications for this visit.    Allergies:   Patient has no known allergies.    Social History:  The patient  reports that he has never smoked. He has never used smokeless tobacco. He reports current alcohol use of about 14.0 standard drinks per week. He reports that he does not use drugs.   Family History:  The patient's Family history is unknown by patient.    ROS:  Please see the history of present illness.   Physical Exam: Blood pressure 138/90, pulse (!) 102, height 5\' 10"  (1.778 m), weight 275 lb 6.4 oz (124.9 kg), SpO2 94 %.  GEN:  Well nourished, well developed in no acute distress HEENT: Normal NECK: No JVD; No carotid bruits LYMPHATICS: No lymphadenopathy CARDIAC: Irreg. Irreg. , no murmurs, rubs, gallops RESPIRATORY:  Clear to auscultation without rales, wheezing or rhonchi  ABDOMEN: Soft, non-tender, non-distended MUSCULOSKELETAL:  No edema; No deformity  SKIN: Warm and dry NEUROLOGIC:  Alert and oriented x 3    EKG:     Jan. 17, 2023:  afib with HR of 102.  No ST or T wave changes.    Recent Labs: No results  found for requested labs within last 8760 hours.    Lipid Panel    Component Value Date/Time   CHOL 163 12/06/2018 1150   TRIG 90 12/06/2018 1150   HDL 65 12/06/2018 1150   CHOLHDL 2.5 12/06/2018 1150   CHOLHDL 4.4 03/13/2015 1152   VLDL 33 (H) 03/13/2015 1152   LDLCALC 81 12/06/2018 1150       Wt Readings from Last 3 Encounters:  02/12/21 275 lb 6.4 oz (124.9 kg)  02/11/21 274 lb 12.8 oz (124.6 kg)  01/07/21 278 lb 12.8 oz (126.5 kg)      Other studies Reviewed: Additional studies/ records that were reviewed today include: . Review of the above records demonstrates:    ASSESSMENT AND PLAN:  1. Persistent atrial fibrillation - Pt is on Afib.  On eliquis .  HR is typically well controlled.   A bit rapid today .  Cont metoprolol.  I've encouraged him to lose weight which should help    2. Hypertension - cont with weight loss efforts.     3. Hyperlipidemia -    Managed by his primary MD    4. Knee arthritis -      The patient needs to have knee surgery .   He is at low risk for his upcoming knee surgery . He may hold his Eliquis for 2-3 days prior to the knee arthroplasty     Current medicines are reviewed at length with the patient today.  The patient does not have concerns regarding medicines.  The following changes have been made:  no change  Labs/ tests ordered today include:   Orders Placed This Encounter  Procedures   EKG 12-Lead   Disposition:   FU with me in 1 year .  Louis Moores, MD  02/12/2021 1:54 PM    [facility] [place], [place], [place]  [address] Phone: [phone]; Fax: [phone]

## 2021-02-12 ENCOUNTER — Ambulatory Visit (INDEPENDENT_AMBULATORY_CARE_PROVIDER_SITE_OTHER): Payer: Medicare Other | Admitting: Cardiovascular Disease

## 2021-02-12 ENCOUNTER — Encounter: Payer: Self-pay | Admitting: Cardiovascular Disease

## 2021-02-12 VITALS — BP 138/90 | HR 102 | Ht 70.0 in | Wt 275.4 lb

## 2021-02-12 DIAGNOSIS — E785 Hyperlipidemia, unspecified: Secondary | ICD-10-CM

## 2021-02-12 DIAGNOSIS — I4819 Other persistent atrial fibrillation: Secondary | ICD-10-CM | POA: Diagnosis not present

## 2021-02-12 MED ORDER — METOPROLOL SUCCINATE ER 100 MG PO TB24: ORAL_TABLET | ORAL | 3 refills | Status: DC

## 2021-02-12 MED ORDER — ATORVASTATIN CALCIUM 40 MG PO TABS: 40.0000 mg | ORAL_TABLET | Freq: Every day | ORAL | 3 refills | Status: DC

## 2021-02-12 MED ORDER — APIXABAN 5 MG PO TABS: 5.0000 mg | ORAL_TABLET | Freq: Two times a day (BID) | ORAL | 3 refills | Status: DC

## 2021-02-12 NOTE — Patient Instructions (Signed)
Medication Instructions:   Your physician recommends that you continue on your current medications as directed. Please refer to the Current Medication list given to you today.  *If you need a refill on your cardiac medications before your next appointment, please call your pharmacy*   Follow-Up: At [facility], you and your health needs are our priority.  As part of our continuing mission to provide you with exceptional heart care, we have created designated Provider Care Teams.  These Care Teams include your primary Cardiologist (physician) and Advanced Practice Providers (APPs -  Physician Assistants and Nurse Practitioners) who all work together to provide you with the care you need, when you need it.  We recommend signing up for the patient portal called "MyChart".  Sign up information is provided on this After Visit Summary.  MyChart is used to connect with patients for Virtual Visits (Telemedicine).  Patients are able to view lab/test results, encounter notes, upcoming appointments, etc.  Non-urgent messages can be sent to your provider as well.   To learn more about what you can do with MyChart, go to [email or web address].    Your next appointment:   1 year(s)  The format for your next appointment:   In Person  Provider:   Mertie Moores, MD  OR AN EXTENDER IN THE OFFICE

## 2021-02-12 NOTE — Addendum Note (Signed)
Addended by: Nuala Alpha on: 02/12/2021 02:13 PM   Modules accepted: Orders

## 2021-02-17 ENCOUNTER — Encounter (INDEPENDENT_AMBULATORY_CARE_PROVIDER_SITE_OTHER): Payer: Self-pay | Admitting: Vascular Surgery

## 2021-02-20 ENCOUNTER — Telehealth: Payer: Self-pay | Admitting: Physician Assistant

## 2021-02-20 NOTE — Telephone Encounter (Signed)
Dr. Acie Fredrickson I believe you saw this patient for preop clearance on 02/12/21. Can you please add clearance to your note for Louis Fuller?  Thank you! Angie

## 2021-03-26 NOTE — Telephone Encounter (Signed)
Our office received another clearance request stating 3rd request. After reviewing the chart, I see that original clearance placed 12/11/20, pt had appt with Dr. Acie Fredrickson 02/12/21 for pre op clearance. I will address this with the pre op provider today to please review if the pt has been cleared. It is not our intentions to delay any pt's surgery.

## 2021-03-26 NOTE — Telephone Encounter (Signed)
Notes faxed to surgeon. This phone note will be removed from the preop pool. Richardson Dopp, PA-C  03/26/2021 6:11 PM

## 2021-03-26 NOTE — Telephone Encounter (Signed)
Dr. Acie Fredrickson You saw this patient on 02/12/2021 for surgical clearance. Can you add recommendations regarding surgery to your note so that we can send it to the surgeon? Please route to P CV DIV Preop. Thank you, Richardson Dopp, PA-C    03/26/2021 1:42 PM

## 2021-07-05 ENCOUNTER — Encounter: Payer: Self-pay | Admitting: Podiatry

## 2021-07-05 ENCOUNTER — Ambulatory Visit (INDEPENDENT_AMBULATORY_CARE_PROVIDER_SITE_OTHER): Payer: Medicare Other | Admitting: Podiatry

## 2021-07-05 DIAGNOSIS — B351 Tinea unguium: Secondary | ICD-10-CM | POA: Diagnosis not present

## 2021-07-05 DIAGNOSIS — M79675 Pain in left toe(s): Secondary | ICD-10-CM | POA: Diagnosis not present

## 2021-07-05 DIAGNOSIS — Z79899 Other long term (current) drug therapy: Secondary | ICD-10-CM | POA: Diagnosis not present

## 2021-07-05 DIAGNOSIS — M79674 Pain in right toe(s): Secondary | ICD-10-CM

## 2021-07-05 MED ORDER — GENTAMICIN SULFATE 0.1 % EX CREA: 1.0000 "application " | TOPICAL_CREAM | Freq: Two times a day (BID) | CUTANEOUS | 1 refills | Status: AC

## 2021-07-05 MED ORDER — DOXYCYCLINE HYCLATE 100 MG PO TABS: 100.0000 mg | ORAL_TABLET | Freq: Two times a day (BID) | ORAL | 0 refills | Status: DC

## 2021-07-05 NOTE — Progress Notes (Signed)
   SUBJECTIVE Patient presents to office today complaining of elongated, thickened nails that cause pain while ambulating in shoes.  Patient is unable to trim their own nails. Patient is here for further evaluation and treatment.  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 Derm Skin is dry and supple bilateral. Negative open lesions or macerations. Remaining integument unremarkable. Nails are tender, long, thickened and dystrophic with subungual debris, consistent with onychomycosis, 1-5 bilateral. No signs of infection noted. Vasc  DP and PT pedal pulses palpable bilaterally. Temperature gradient within normal limits.  Neuro Epicritic and protective threshold sensation grossly intact bilaterally.  Musculoskeletal Exam No symptomatic pedal deformities noted bilateral. Muscular strength within normal limits.  ASSESSMENT 1.  Pain due to onychomycosis of toenails both 2.  Hallux valgus bilateral 3.  Infection toenail right hallux  PLAN OF CARE 1. Patient evaluated today.  2. Instructed to maintain good pedal hygiene and foot care.  3. Mechanical debridement of nails 1-5 bilaterally performed using a nail nipper. Filed with dremel without incident.  4.  Prescription for gentamicin cream apply daily to the right hallux nail plate  5.  Prescription for doxycycline 100 mg 2 times daily #20  6.  Today we discussed different treatment options for onychomycosis/fungal nail infections.  Recommend oral Lamisil 250 mg daily  #90.  Prior to this we will order hepatic function panel.  Order placed.   7.  Return to clinic 2 weeks to review LFT results and for further treatment and evaluation   Edrick Kins, DPM [facility]  Dr. Edrick Kins, DPM    [address], [place] [address]                Office [phone]  Fax [phone]

## 2021-07-11 LAB — HEPATIC FUNCTION PANEL
ALT: 28 IU/L (ref 0–44)
AST: 21 IU/L (ref 0–40)
Albumin: 4.5 g/dL (ref 3.7–4.7)
Alkaline Phosphatase: 97 IU/L (ref 44–121)
Bilirubin Total: 0.7 mg/dL (ref 0.0–1.2)
Bilirubin, Direct: 0.22 mg/dL (ref 0.00–0.40)
Total Protein: 6.8 g/dL (ref 6.0–8.5)

## 2021-07-19 ENCOUNTER — Ambulatory Visit (INDEPENDENT_AMBULATORY_CARE_PROVIDER_SITE_OTHER): Payer: Medicare Other | Admitting: Podiatry

## 2021-07-19 DIAGNOSIS — B351 Tinea unguium: Secondary | ICD-10-CM

## 2021-07-19 MED ORDER — TERBINAFINE HCL 250 MG PO TABS: 250.0000 mg | ORAL_TABLET | Freq: Every day | ORAL | 0 refills | Status: AC

## 2021-09-16 ENCOUNTER — Other Ambulatory Visit: Payer: Self-pay | Admitting: Podiatry

## 2021-11-22 ENCOUNTER — Encounter (INDEPENDENT_AMBULATORY_CARE_PROVIDER_SITE_OTHER): Payer: Medicare Other | Admitting: Ophthalmology

## 2021-11-22 DIAGNOSIS — H35342 Macular cyst, hole, or pseudohole, left eye: Secondary | ICD-10-CM | POA: Diagnosis not present

## 2021-11-22 DIAGNOSIS — H43813 Vitreous degeneration, bilateral: Secondary | ICD-10-CM | POA: Diagnosis not present

## 2021-11-22 DIAGNOSIS — H35372 Puckering of macula, left eye: Secondary | ICD-10-CM | POA: Diagnosis not present

## 2021-12-05 ENCOUNTER — Telehealth: Payer: Self-pay | Admitting: Pharmacist

## 2021-12-05 NOTE — Telephone Encounter (Signed)
Eliquis approved through 12/05/22

## 2021-12-10 ENCOUNTER — Telehealth (INDEPENDENT_AMBULATORY_CARE_PROVIDER_SITE_OTHER): Payer: Self-pay | Admitting: Vascular Surgery

## 2021-12-10 NOTE — Telephone Encounter (Signed)
Please call this number [phone]

## 2021-12-10 NOTE — Telephone Encounter (Signed)
This wouldn't affect the work that has been as it has all been treatment of superficial veins.  I would recommend that based on the symptoms, he be seen and evaluated by his PCP

## 2021-12-10 NOTE — Telephone Encounter (Signed)
Patients wife called in stating patient fell. Patient fell on 11/26/2021.  Patient is having some bruising, discoloration, painful very tinder to touch, and also swollen in the left leg. All of this right below the knee. Patient is wanting to know if these symptoms could effect the work Dr. Delana Meyer has done and if he thinks he needs to come in to be seen    Please call and advise

## 2021-12-11 NOTE — Telephone Encounter (Signed)
Per report from [place] the patient's wife returned a phone call to states that she was concerned about the patient's varicosities and not about the fact that he had fallen.  I contacted the patient directly and he notes that he does have some bulging varicosities but that is not his greatest concern.  His greatest concern is with the swelling and the pain that he is currently having in his lower extremity.  I suspect that this is not caused by sudden worsening of his varicosities but by the fall itself.  The patient also notes that he is not done anything to help treat this fall.  He is not elevated and he is only done a small amount of ice.  I did directed him that typically the treatment for a fall would be rest, ice, elevation and compression of the area.  I also advised the patient directly to contact his PCP as well to evaluate if there were any broken bones or ligament tears or strains or other issues that we would not evaluate for.  The patient notes that he did not feel that anything was broken and that he did not feel that he needed to see his primary care provider at this time.  I advised the patient that we would be happy to evaluate his varicose veins but his varicose veins are the likely overall cause of the pain and swelling that he is having, this is likely a direct result of the fall itself and that this typically will resolve with in time.  If the patient would like to come in in a few weeks to evaluate the veins this would be reasonable if he is continuing to have issues.  After discussing with the patient he also noted that he would attempt to use rest, elevation and ice to see if this resolves his symptoms.  He was again encouraged to contact his PCP for further and more thorough evaluation.

## 2022-02-07 ENCOUNTER — Other Ambulatory Visit: Payer: Self-pay | Admitting: Cardiovascular Disease

## 2022-03-07 ENCOUNTER — Other Ambulatory Visit: Payer: Self-pay | Admitting: Cardiovascular Disease

## 2022-03-12 NOTE — Progress Notes (Signed)
MRN : [id number]  Louis Fuller is a 77 y.o. ([birth year]) male who presents with chief complaint of legs swell.  History of Present Illness:   The patient returns to the office for followup evaluation regarding leg swelling.  The swelling has persisted and the pain associated with swelling continues. There have not been any interval development of a ulcerations or wounds.  Since the previous visit the patient has been wearing graduated compression stockings and has noted little if any improvement in the lymphedema. The patient has been using compression routinely morning until night.  The patient also states elevation during the day and exercise is being done too.  No outpatient medications have been marked as taking for the 03/13/22 encounter (Appointment) with Delana Meyer, Dolores Lory, MD.    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   Longstanding persistent atrial fibrillation (HCC)    Mitral regurgitation    Obesity    Peripheral vascular disease ([place])    Varicose veins     Past Surgical History:  Procedure Laterality Date   CARDIOVERSION N/A 10/06/2013   Procedure: CARDIOVERSION;  Surgeon: Lelon Perla, MD;  Location: [place];  Service: Cardiovascular;  Laterality: N/A;   CATARACT EXTRACTION     left   CATARACT EXTRACTION     COLONOSCOPY     COLONOSCOPY WITH PROPOFOL N/A 12/26/2014   Procedure: COLONOSCOPY WITH PROPOFOL;  Surgeon: Lollie Sails, MD;  Location: [facility] ENDOSCOPY;  Service: Endoscopy;  Laterality: N/A;   COLONOSCOPY WITH PROPOFOL N/A 03/27/2015   Procedure: COLONOSCOPY WITH PROPOFOL;  Surgeon: Lollie Sails, MD;  Location: [facility] ENDOSCOPY;  Service: Endoscopy;  Laterality: N/A;   COLONOSCOPY WITH  PROPOFOL N/A 10/22/2016   Procedure: COLONOSCOPY WITH PROPOFOL;  Surgeon: Toledo, Benay Pike, MD;  Location: [facility] ENDOSCOPY;  Service: Endoscopy;  Laterality: N/A;   COLONOSCOPY WITH PROPOFOL N/A 06/22/2020   Procedure: COLONOSCOPY WITH PROPOFOL;  Surgeon: Lesly Rubenstein, MD;  Location: [facility] ENDOSCOPY;  Service: Endoscopy;  Laterality: N/A;   ENDOVENOUS ABLATION SAPHENOUS VEIN W/ LASER     ESOPHAGOGASTRODUODENOSCOPY (EGD) WITH PROPOFOL N/A 12/26/2014   Procedure: ESOPHAGOGASTRODUODENOSCOPY (EGD) WITH PROPOFOL;  Surgeon: Lollie Sails, MD;  Location: [facility] ENDOSCOPY;  Service: Endoscopy;  Laterality: N/A;   EYE SURGERY     HERNIA REPAIR     vein laser surgery      Social History Social History   Tobacco Use   Smoking status: Never   Smokeless tobacco: Never  Vaping Use   Vaping Use: Never used  Substance Use Topics   Alcohol use: Yes    Alcohol/week: 14.0 standard drinks of alcohol    Types: 14 Cans of beer per week    Comment: 2 beers a day   Drug use: No    Family History Family History  Family history unknown: Yes    No Known Allergies   REVIEW OF SYSTEMS (Negative unless checked)  Constitutional: []$ Weight loss  []$ Fever  []$ Chills Cardiac: []$ Chest pain   []$ Chest pressure   []$ Palpitations   []$ Shortness of breath when laying flat   []$ Shortness of breath with exertion. Vascular:  []$ Pain in legs with walking   [x]$ Pain in legs  with standing  []$ History of DVT   []$ Phlebitis   [x]$ Swelling in legs   [x]$ Varicose veins   []$ Non-healing ulcers Pulmonary:   []$ Uses home oxygen   []$ Productive cough   []$ Hemoptysis   []$ Wheeze  []$ COPD   []$ Asthma Neurologic:  []$ Dizziness   []$ Seizures   []$ History of stroke   []$ History of TIA  []$ Aphasia   []$ Vissual changes   []$ Weakness or numbness in arm   []$ Weakness or numbness in leg Musculoskeletal:   []$ Joint swelling   [x]$ Joint pain   []$ Low back pain Hematologic:  []$ Easy bruising  []$ Easy bleeding   []$ Hypercoagulable state   []$ Anemic Gastrointestinal:   []$ Diarrhea   []$ Vomiting  [x]$ Gastroesophageal reflux/heartburn   []$ Difficulty swallowing. Genitourinary:  []$ Chronic kidney disease   []$ Difficult urination  []$ Frequent urination   []$ Blood in urine Skin:  []$ Rashes   []$ Ulcers  Psychological:  []$ History of anxiety   []$  History of major depression.  Physical Examination  There were no vitals filed for this visit. There is no height or weight on file to calculate BMI. Gen: WD/WN, NAD Head: [place]/AT, No temporalis wasting.  Ear/Nose/Throat: Hearing grossly intact, nares w/o erythema or drainage, pinna without lesions Eyes: PER, EOMI, sclera nonicteric.  Neck: Supple, no gross masses.  No JVD.  Pulmonary:  Good air movement, no audible wheezing, no use of accessory muscles.  Cardiac: RRR, precordium not hyperdynamic. Vascular:  scattered varicosities present bilaterally.  Mild venous stasis changes to the legs bilaterally.  2-3+ soft pitting edema, CEAP [id number]  Vessel Right Left  Radial Palpable Palpable  Gastrointestinal: soft, non-distended. No guarding/no peritoneal signs.  Musculoskeletal: M/S 5/5 throughout.  No deformity.  Neurologic: CN 2-12 intact. Pain and light touch intact in extremities.  Symmetrical.  Speech is fluent. Motor exam as listed above. Psychiatric: Judgment intact, Mood & affect appropriate for pt's clinical situation. Dermatologic: Venous rashes no ulcers noted.  No changes consistent with cellulitis. Lymph : No lichenification or skin changes of chronic lymphedema.  CBC Lab Results  Component Value Date   WBC 8.6 03/27/2015   HGB 14.2 03/27/2015   HCT 41.8 03/27/2015   MCV 92.9 03/27/2015   PLT 200 03/27/2015    BMET    Component Value Date/Time   NA 142 12/06/2018 1150   K 5.1 12/06/2018 1150   CL 102 12/06/2018 1150   CO2 24 12/06/2018 1150   GLUCOSE 84 12/06/2018 1150   GLUCOSE 94 03/13/2015 1152   BUN 12 12/06/2018 1150   CREATININE 0.91 12/06/2018 1150   CREATININE 0.93 03/13/2015 1152   CALCIUM  9.2 12/06/2018 1150   GFRNONAA 85 12/06/2018 1150   GFRAA 98 12/06/2018 1150   CrCl cannot be calculated (Patient's most recent lab result is older than the maximum 21 days allowed.).  COAG Lab Results  Component Value Date   INR 1.03 03/27/2015   INR 1.0 09/27/2013    Radiology No results found.   Assessment/Plan 1. Lymphedema Recommend:  No surgery or intervention at this point in time.    I have reviewed my discussion with the patient regarding lymphedema and why it  causes symptoms.  Patient will continue wearing graduated compression on a daily basis. The patient should put the compression on first thing in the morning and removing them in the evening. The patient should not sleep in the compression.   In addition, behavioral modification throughout the day will be continued.  This will include frequent elevation (such as in a recliner), use of over the counter pain medications as  needed and exercise such as walking.  The systemic causes for chronic edema such as liver, kidney and cardiac etiologies does not appear to have significant changed over the past year.    The patient will continue aggressive use of the  lymph pump.  This will continue to improve the edema control and prevent sequela such as ulcers and infections.   The patient will follow-up with me on an annual basis.   2. Venous insufficiency Recommend:  No surgery or intervention at this point in time.    I have reviewed my discussion with the patient regarding lymphedema and why it  causes symptoms.  Patient will continue wearing graduated compression on a daily basis. The patient should put the compression on first thing in the morning and removing them in the evening. The patient should not sleep in the compression.   In addition, behavioral modification throughout the day will be continued.  This will include frequent elevation (such as in a recliner), use of over the counter pain medications as needed  and exercise such as walking.  The systemic causes for chronic edema such as liver, kidney and cardiac etiologies does not appear to have significant changed over the past year.    The patient will continue aggressive use of the  lymph pump.  This will continue to improve the edema control and prevent sequela such as ulcers and infections.   The patient will follow-up with me on an annual basis.   3. Chronic atrial fibrillation (HCC) Continue antiarrhythmia medications as already ordered, these medications have been reviewed and there are no changes at this time.  Continue anticoagulation as ordered by Cardiology Service  4. Gastroesophageal reflux disease without esophagitis Continue PPI as already ordered, this medication has been reviewed and there are no changes at this time.  Avoidence of caffeine and alcohol  Moderate elevation of the head of the bed   5. Mixed hyperlipidemia Continue statin as ordered and reviewed, no changes at this time    Hortencia Pilar, MD  03/12/2022 3:25 PM

## 2022-03-13 ENCOUNTER — Ambulatory Visit (INDEPENDENT_AMBULATORY_CARE_PROVIDER_SITE_OTHER): Payer: Medicare Other | Admitting: Vascular Surgery

## 2022-03-13 ENCOUNTER — Telehealth: Payer: Self-pay | Admitting: Cardiovascular Disease

## 2022-03-13 ENCOUNTER — Encounter (INDEPENDENT_AMBULATORY_CARE_PROVIDER_SITE_OTHER): Payer: Self-pay | Admitting: Vascular Surgery

## 2022-03-13 VITALS — BP 130/84 | HR 82 | Resp 16 | Wt 293.6 lb

## 2022-03-13 DIAGNOSIS — I482 Chronic atrial fibrillation, unspecified: Secondary | ICD-10-CM | POA: Diagnosis not present

## 2022-03-13 DIAGNOSIS — E782 Mixed hyperlipidemia: Secondary | ICD-10-CM

## 2022-03-13 DIAGNOSIS — I890 Lymphedema, not elsewhere classified: Secondary | ICD-10-CM | POA: Diagnosis not present

## 2022-03-13 DIAGNOSIS — K219 Gastro-esophageal reflux disease without esophagitis: Secondary | ICD-10-CM

## 2022-03-13 DIAGNOSIS — I872 Venous insufficiency (chronic) (peripheral): Secondary | ICD-10-CM

## 2022-03-13 NOTE — Telephone Encounter (Signed)
   Pre-operative Risk Assessment    Patient Name: Louis Fuller  DOB: Dec 30, 1945 MRN: [id number]     Request for Surgical Clearance    Procedure:  Dental Extraction - Amount of Teeth to be Pulled:  2  Date of Surgery:  Clearance 03/18/22                                 Surgeon:  Nanine Means DDS Surgeon's Group or Practice Name:  [facility] Phone number:  [phone] Fax number:  [phone]   Type of Clearance Requested:   - Medical  - Pharmacy:  Hold Apixaban (Eliquis) TBD by cardiology typically does 3 days prior   Type of Anesthesia:  Local    Additional requests/questions:    [name]   03/13/2022, 4:24 PM

## 2022-03-14 NOTE — Telephone Encounter (Signed)
   Patient Name: Louis Fuller  DOB: November 18, 1945 MRN: [id number]  Primary Cardiologist: Mertie Moores, MD  Chart reviewed as part of pre-operative protocol coverage.   Dental extractions of 1 or 2 teeth are considered low risk procedures per guidelines and generally do not require any specific cardiac clearance. It is also generally accepted that for extractions of 1 or 2 teeth and dental cleanings, there is no need to interrupt blood thinner therapy.  SBE prophylaxis is not required for the patient from a cardiac standpoint.  I will route this recommendation to the requesting party via Epic fax function and remove from pre-op pool.  Please call with questions.  Tami Lin Kylian Loh, PA 03/14/2022, 11:41 AM

## 2022-03-16 ENCOUNTER — Encounter (INDEPENDENT_AMBULATORY_CARE_PROVIDER_SITE_OTHER): Payer: Self-pay | Admitting: Vascular Surgery

## 2022-03-17 NOTE — Telephone Encounter (Signed)
Caller stated they will need written confirmation of clearance sent to fax# [phone].  Caller stated they also previously faxed a written clearance request on 2/16.

## 2022-03-17 NOTE — Telephone Encounter (Signed)
   Primary Cardiologist: Mertie Moores, MD  Chart reviewed as part of pre-operative protocol coverage. Simple dental extractions are considered low risk procedures per guidelines and generally do not require any specific cardiac clearance. It is also generally accepted that for simple extractions and dental cleanings, there is no need to interrupt blood thinner therapy.   SBE prophylaxis is not required for the patient.  Patient is at lower CV risk, ok for him to hold Eliquis for 1-2 days prior to dental work if his dentist prefers.   I will route this recommendation to the requesting party via Epic fax function and remove from pre-op pool.  Please call with questions.  Emmaline Life, NP-C  03/17/2022, 4:41 PM [address] Office [phone] Fax [phone]

## 2022-03-17 NOTE — Telephone Encounter (Signed)
Lvm with exact instructions of surgical clearance and lvm this was sent to dental office on 2/16. Will remove from pre op pool.

## 2022-03-17 NOTE — Telephone Encounter (Signed)
Caller is following up on status of patient's clearance.

## 2022-03-17 NOTE — Telephone Encounter (Signed)
Just an update and FYI; our pre op protocol that we have had in place for 5+ yrs. Our protocol is that once we receive the clearance request, we input your request in the chart electronically. All of the requested information has been input correctly. This is then forwarded to our pre op team to review and provide clearance as well as any recommendations. We no longer hold onto the forms from the requesting office as we have 7 satellite offices. Having one format that our cardiologist and pre op APP can follow for accuracy and completion as to having 7 satellite offices. Our pt's are our number one priority. Providing the best care and being thorough for our pt is essential and not delaying their care.   I hope this helps you understand our process. All of the information that is being requested for the doctor is available in the notes that were sent to your office.

## 2022-03-17 NOTE — Telephone Encounter (Signed)
I s/w the pt and stated that we had faxed over the clearance notes last week. I went over the recommendations with the pt and that he did not need to hold his blood thinner. Pt states to me that the oral surgeon will be cutting into his gums to get the 2 teeth out top prepare for dental implant in the future. Pt said these will be surgical extractions and is this going to make a difference for his blood thinner. I informed the pt that the DDS did not let us know if simple or surgical extractions. I will run this back through our pre op team and pre op pharm-d. I stated that I will call back today. Pt states his procedure has been moved to Friday 03/21/22. Pt said thank you for the help.

## 2022-03-17 NOTE — Telephone Encounter (Signed)
Patient is following up. He states he spoke with the requesting office and they did not receive the faxed recommendation. They are wanting to know if it can be faxed again. Patient requested a call back to discuss his clearance as well.

## 2022-03-17 NOTE — Telephone Encounter (Signed)
Patient with diagnosis of afib on Eliquis for anticoagulation.    Procedure: 2 dental extractions Date of procedure: 03/21/22  CHA2DS2-VASc Score = 3  This indicates a 3.2% annual risk of stroke. The patient's score is based upon: CHF History: 0 HTN History: 1 Diabetes History: 0 Stroke History: 0 Vascular Disease History: 1 Age Score: 1 Gender Score: 0   CrCl 73m/min using adjusted body weight Platelet count 164K  Patient does not require pre-op antibiotics for dental procedure.  Pt is at lower CV risk, ok for him to hold Eliquis for 1-2 days prior to dental work if his dentist prefers.  **This guidance is not considered finalized until pre-operative APP has relayed final recommendations.**

## 2022-03-18 NOTE — Telephone Encounter (Signed)
I will re-fax notes again to the fax number that we have been given.

## 2022-03-18 NOTE — Telephone Encounter (Signed)
[facility] is calling to state they have not received documentation or clearance on this patient. Requesting clearance be resent.

## 2022-03-18 NOTE — Telephone Encounter (Signed)
I s/w the DDS and they state they still did not get the fax. Pamala Hurry told me that she just went through what sounds very similar to our fax process through [facility].  Pamala Hurry asked me if I could just email it to her at: [email or web address]

## 2022-03-31 ENCOUNTER — Encounter: Payer: Self-pay | Admitting: Cardiovascular Disease

## 2022-03-31 NOTE — Progress Notes (Unsigned)
Cardiology Office Note   Date:  04/01/2022   ID:  Louis Fuller, DOB 01/10/1946, MRN [id number]  PCP:  Louis Morel, MD  Cardiologist:   Louis Moores, MD   Chief Complaint  Patient presents with   Atrial Fibrillation        1. Paroxysmal atrial fibrillation 2. Hypertension 3. Hyperlipidemia 4. Knee arthritis -       Louis Fuller is a 77 y.o. gentleman with a Hx of paroxysmal Atrial Fibrillation.  He notices it significant heart irregularities after he comes in from own grass or doing other yard work. He takes an extra half of of Toprol that seemed to help. He gets some extra exercise  March 04, 2012:  Louis Fuller is doing well.  He has some rare episodes of palpitations that clinically sound like atrial fib.  The episode lasted most of the night.   He takes propranolol on occasion for these episodes of A-Fib.  July 05, 2013:  He has moved to [place] since I last saw him.    He needs to have knee replacements.    He has vericose veins in his legs and has leg swelling.    No CP, no dyspnea.    He is in A-fib today at his office visit.   August 03, 2013:    He is still in atrial fib.  He is basically asymptomatic.  He's able to do all of his normal activities without any problems. He denies any chest pain or shortness of breath.  He has not had his myoview yet.    Echo revealed. Normal left ventricular systolic function and mild mitral regurgitation, trivial aortic insufficiency.    August 23, 2013: Louis Fuller is seen back - he is still A. symptomatically and basically cannot tell that is in atrial fibrillation. He remains in atrial fibrillation. He's able to do all his normal activities without any issues. He's tolerating the Flecainide well. He's on Eliquis and has not noted any bleeding.  Sept. 1, 2015: Louis Fuller is seen  - doing well with current meds.  Ready to have a cardioversion scheduled.  Nov. 3, 2015: Louis Fuller had a successful cardioversion about a month or so ago. Several days  after that he felt that his heart was beating correctly. He is seen today for followup and in fact is back in atrial fibrillation. He continues to be asymptomatic.     Jun 15, 2014: Louis Fuller is a 77 y.o. male who presents for follow-up of his atrial fibrillation. He has been seen by Dr. Rayann Heman for consideration for A. fib ablation. Given his lack of symptoms, A. fib ablation is not currently indicated. He declined one to start Tikosyn. His echocardiogram in June, 2015 showed normal left ventricle systolic function.  September 14, 2014:  Doing well.  No symptoms  Limited by knee pain .   Feb. 14, 2017: Doing well from a cardiac standpoint.   Lots of other issues  - was found to have a precancerous lesion on his colon.  Needs to have 4 teeth removed. Will hold his Eliquis for 2 days prior.   Wife, Louis Fuller had a mass removed from her head.   Might be an infection .   Sept. 18, 2017:  Doing well.   HR is well controlled.     All other issues are well controlled.   Has colon polyps ,  Gets colonoscopy yearly .   Sept. 11, 2018:  Wt is 249 today  Louis Fuller is  seen for follow up of his chronic Afib. Has mild LV dysfunction by echo in May 2016, mild MR  Has been doing ok Is having a colonocsopy Friday  Will take the last dose of Eliquis on Wednesday .   Sept. 9, 2019: Louis Fuller is seen for his chronic Afib. No cp or dyspnea.   Is not exercising as much as he should  Doing well .   We discussed weight .   Advised weight loss   Nov. 9, 2020  Louis Fuller is seen for pre op eval  For knee surgery .  Overall doing well.  Wife, Louis Fuller has has been busy taking care of her mother    12/16/2019: Louis Fuller has not had his knee surgery .  Wt. Is 265. Trying to lose weight .  Eating [place] bacon some  No cp, no dyspnea   Jan. 17, 2023  Louis Fuller is seen for follow up of his atrial fib Wt is 275 lbs  HR is a bit elevated,  its normal at home   Has leg edema .   Legs weep on occasion , does not want to  start a diuretic Advised leg elevation , more ambulation     April 01, 2022 Louis Fuller is seen seen for follow up of his afib, obesity Seen with wife, Louis Fuller today   Wt is 286 lbs ( up 9 lbs )   No CP or dyspnea   Labs from his primary medical doctor in the [facility] system reveals Total cholesterol is 158 LDL is 71 HDL is 58 Triglyceride levels 146.  Continue current medications.    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   Longstanding persistent atrial fibrillation (HCC)    Mitral regurgitation    Obesity    Peripheral vascular disease ([place])    Varicose veins     Past Surgical History:  Procedure Laterality Date   CARDIOVERSION N/A 10/06/2013   Procedure: CARDIOVERSION;  Surgeon: Lelon Perla, MD;  Location: [facility];  Service: Cardiovascular;  Laterality: N/A;   CATARACT EXTRACTION     left   CATARACT EXTRACTION     COLONOSCOPY     COLONOSCOPY WITH PROPOFOL N/A 12/26/2014   Procedure: COLONOSCOPY WITH PROPOFOL;  Surgeon: Lollie Sails, MD;  Location[facility] END[facility]COPY;  Service: Endoscopy;  Laterality: N/A;   COLONOSCOPY WITH PROPOFOL N/A 03/27/2015   Procedure: COLONOSCOPY WITH PROPOFOL;  Surgeon: Lollie Sails, MD;  Location: [facility] ENDOSCOPY;  Service: Endoscopy;  Laterality: N/A;   COLONOSCOPY WITH PROPOFOL N/A 10/22/2016   Procedure: COLONOSCOPY WITH PROPOFOL;  Surgeon: Toledo, Benay Pike, MD;  Location: [facility] ENDOSCOPY;  Service: Endoscopy;  Laterality: N/A;   COLONOSCOPY WITH PROPOFOL N/A 06/22/2020   Procedure: COLONOSCOPY WITH PROPOFOL;  Surgeon: Lesly Rubenstein, MD;  Location: [facility] ENDOSCOPY;  Service: Endoscopy;  Laterality: N/A;   ENDOVENOUS ABLATION SAPHENOUS VEIN W/ LASER      ESOPHAGOGASTRODUODENOSCOPY (EGD) WITH PROPOFOL N/A 12/26/2014   Procedure: ESOPHAGOGASTRODUODENOSCOPY (EGD) WITH PROPOFOL;  Surgeon: Lollie Sails, MD;  Location[facility] ENDOSCOPY;  Service: Endoscopy;  Laterality: N/A;   EYE SURGERY     HERNIA REPAIR     vein laser surgery  Current Outpatient Medications  Medication Sig Dispense Refill   apixaban (ELIQUIS) 5 MG TABS tablet Take 1 tablet (5 mg total) by mouth 2 (two) times daily. 180 tablet 3   atorvastatin (LIPITOR) 40 MG tablet Take 1 tablet (40 mg total) by mouth daily. 30 tablet 0   Carboxymethylcellul-Glycerin (REFRESH OPTIVE OP) Place 1 drop into both eyes daily as needed (for itchy eyes).      desonide (DESOWEN) 0.05 % ointment Apply 1 application topically daily as needed. Apply to affected areas topically as needed.     gentamicin cream (GARAMYCIN) 0.1 % Apply 1 application  topically 2 (two) times daily. 30 g 1   metoprolol succinate (TOPROL-XL) 100 MG 24 hr tablet TAKE 1 TABLET DAILY WITH OR IMMEDIATELY FOLLOWING A MEAL 90 tablet 0   Multiple Vitamin (MULTIVITAMIN) tablet Take 1 tablet by mouth daily.     omeprazole (PRILOSEC) 40 MG capsule Take 40 mg by mouth daily.     sildenafil (REVATIO) 20 MG tablet Take 20 mg by mouth daily as needed for erectile dysfunction.     terbinafine (LAMISIL) 250 MG tablet Take 1 tablet (250 mg total) by mouth daily. 90 tablet 0   No current facility-administered medications for this visit.    Allergies:   Patient has no known allergies.    Social History:  The patient  reports that he has never smoked. He has never used smokeless tobacco. He reports current alcohol use of about 14.0 standard drinks of alcohol per week. He reports that he does not use drugs.   Family History:  The patient's Family history is unknown by patient.    ROS:  Please see the history of present illness.     Physical Exam: Blood pressure 138/76, pulse 78, height '5\' 10"'$  (1.778 m), weight 286 lb 12.8 oz  (130.1 kg), SpO2 97 %.       GEN:  moderately obese male,  HEENT: Normal NECK: No JVD; No carotid bruits LYMPHATICS: No lymphadenopathy CARDIAC: irreg. Irreg.   RESPIRATORY:  Clear to auscultation without rales, wheezing or rhonchi  ABDOMEN: Soft, non-tender, non-distended MUSCULOSKELETAL:  2+ tense pitting edema  No deformity  SKIN: Warm and dry NEUROLOGIC:  Alert and oriented x 3    EKG:      April 01, 2022: Atrial fibrillation at a heart rate of 74.   Recent Labs: 07/10/2021: ALT 28    Lipid Panel    Component Value Date/Time   CHOL 163 12/06/2018 1150   TRIG 90 12/06/2018 1150   HDL 65 12/06/2018 1150   CHOLHDL 2.5 12/06/2018 1150   CHOLHDL 4.4 03/13/2015 1152   VLDL 33 (H) 03/13/2015 1152   LDLCALC 81 12/06/2018 1150       Wt Readings from Last 3 Encounters:  04/01/22 286 lb 12.8 oz (130.1 kg)  03/13/22 293 lb 9.6 oz (133.2 kg)  02/12/21 275 lb 6.4 oz (124.9 kg)      Other studies Reviewed: Additional studies/ records that were reviewed today include: . Review of the above records demonstrates:    ASSESSMENT AND PLAN:  1. Persistent atrial fibrillation -Ted remains in atrial fibrillation.  His rate is well-controlled.  Continue current medications.   2. Hypertension -  BP is well controlled.  3. Hyperlipidemia -   Labs from his primary medical doctor in the [facility] system reveals Total cholesterol is 158 LDL is 71 HDL is 58 Triglyceride levels 146.  Continue current medications.     4. Knee arthritis -  Current medicines are reviewed at length with the patient today.  The patient does not have concerns regarding medicines.  The following changes have been made:  no change  Labs/ tests ordered today include:   No orders of the defined types were placed in this encounter.  Disposition:   FU with me in 1 year .       Louis Moores, MD  04/01/2022 11:28 AM    [address],  [place], [place]  [address] Phone: [phone]; Fax: [phone]

## 2022-04-01 ENCOUNTER — Ambulatory Visit: Payer: Medicare Other | Attending: Cardiovascular Disease | Admitting: Cardiovascular Disease

## 2022-04-01 ENCOUNTER — Encounter: Payer: Self-pay | Admitting: Cardiovascular Disease

## 2022-04-01 VITALS — BP 138/76 | HR 78 | Ht 70.0 in | Wt 286.8 lb

## 2022-04-01 DIAGNOSIS — I10 Essential (primary) hypertension: Secondary | ICD-10-CM | POA: Diagnosis present

## 2022-04-01 DIAGNOSIS — I4819 Other persistent atrial fibrillation: Secondary | ICD-10-CM | POA: Diagnosis present

## 2022-04-01 DIAGNOSIS — Z6841 Body Mass Index (BMI) 40.0 and over, adult: Secondary | ICD-10-CM

## 2022-04-01 DIAGNOSIS — E782 Mixed hyperlipidemia: Secondary | ICD-10-CM

## 2022-04-01 MED ORDER — METOPROLOL SUCCINATE ER 100 MG PO TB24: ORAL_TABLET | ORAL | 3 refills | Status: DC

## 2022-04-01 MED ORDER — APIXABAN 5 MG PO TABS: 5.0000 mg | ORAL_TABLET | Freq: Two times a day (BID) | ORAL | 3 refills | Status: DC

## 2022-04-01 MED ORDER — ATORVASTATIN CALCIUM 40 MG PO TABS: 40.0000 mg | ORAL_TABLET | Freq: Every day | ORAL | 3 refills | Status: DC

## 2022-04-01 NOTE — Patient Instructions (Signed)
Medication Instructions:  Your physician recommends that you continue on your current medications as directed. Please refer to the Current Medication list given to you today.  *If you need a refill on your cardiac medications before your next appointment, please call your pharmacy*   Lab Work: None  If you have labs (blood work) drawn today and your tests are completely normal, you will receive your results only by: [place] (if you have MyChart) OR A paper copy in the mail If you have any lab test that is abnormal or we need to change your treatment, we will call you to review the results.   Testing/Procedures: None    Follow-Up: At [facility], you and your health needs are our priority.  As part of our continuing mission to provide you with exceptional heart care, we have created designated Provider Care Teams.  These Care Teams include your primary Cardiologist (physician) and Advanced Practice Providers (APPs -  Physician Assistants and Nurse Practitioners) who all work together to provide you with the care you need, when you need it.  We recommend signing up for the patient portal called "MyChart".  Sign up information is provided on this After Visit Summary.  MyChart is used to connect with patients for Virtual Visits (Telemedicine).  Patients are able to view lab/test results, encounter notes, upcoming appointments, etc.  Non-urgent messages can be sent to your provider as well.   To learn more about what you can do with MyChart, go to [email or web address].    Your next appointment:   1 year(s)  Provider:   Mertie Moores, MD

## 2022-04-04 ENCOUNTER — Other Ambulatory Visit: Payer: Self-pay

## 2022-04-04 DIAGNOSIS — I4819 Other persistent atrial fibrillation: Secondary | ICD-10-CM

## 2022-04-04 DIAGNOSIS — I10 Essential (primary) hypertension: Secondary | ICD-10-CM

## 2022-04-04 DIAGNOSIS — E782 Mixed hyperlipidemia: Secondary | ICD-10-CM

## 2022-04-04 MED ORDER — METOPROLOL SUCCINATE ER 100 MG PO TB24: 100.0000 mg | ORAL_TABLET | Freq: Every day | ORAL | 3 refills | Status: DC

## 2022-04-10 ENCOUNTER — Telehealth: Payer: Self-pay | Admitting: Cardiovascular Disease

## 2022-04-10 NOTE — Telephone Encounter (Signed)
[facility] is calling to get clarification on patient's medication

## 2022-04-10 NOTE — Telephone Encounter (Signed)
Robert from [facility] and he wanted clarification on metoprolol succinate '100mg'$ . He stated the Rx does not say how many tablets or how often. I informed him the Rx from Dr. Acie Fredrickson says take 1 tablet (100 mg) PO once daily. He transferred me to a pharmacy tech Ellwood Handler who I repeated the instructions to Curt Bears an 1 tablet ('100mg'$ )  PO once daily. Dispemse 90 tabs with 3 refills. She read the order back to me. She stated she submitted Rx

## 2022-07-18 ENCOUNTER — Telehealth: Payer: Self-pay | Admitting: *Deleted

## 2022-07-18 NOTE — Telephone Encounter (Signed)
   Pre-operative Risk Assessment    Patient Name: Louis Fuller  DOB: 01/01/1946 MRN: [id number]     Request for Surgical Clearance    Procedure:   DENTAL IMPLANT OF ONE TOOTH  Date of Surgery:  Clearance TBD                                 Surgeon:  NOT LISTED Surgeon's Group or Practice Name:  [facility]  Phone number:  [phone] ATTN: Tresa Endo Fax number:  [phone]   Type of Clearance Requested:   - Medical  - Pharmacy:  Hold Apixaban (Eliquis)     Type of Anesthesia:  Not Indicated (LOCAL?)   Additional requests/questions:    Louis Fuller   07/18/2022, 12:15 PM

## 2022-07-21 ENCOUNTER — Telehealth: Payer: Self-pay

## 2022-07-21 NOTE — Telephone Encounter (Signed)
  Patient Consent for Virtual Visit        Louis Fuller has provided verbal consent on 07/21/2022 for a virtual visit (video or telephone).   CONSENT FOR VIRTUAL VISIT FOR:  Louis Fuller  By participating in this virtual visit I agree to the following:  I hereby voluntarily request, consent and authorize [facility] and its employed or contracted physicians, physician assistants, nurse practitioners or other licensed health care professionals (the Practitioner), to provide me with telemedicine health care services (the "Services") as deemed necessary by the treating Practitioner. I acknowledge and consent to receive the Services by the Practitioner via telemedicine. I understand that the telemedicine visit will involve communicating with the Practitioner through live audiovisual communication technology and the disclosure of certain medical information by electronic transmission. I acknowledge that I have been given the opportunity to request an in-person assessment or other available alternative prior to the telemedicine visit and am voluntarily participating in the telemedicine visit.  I understand that I have the right to withhold or withdraw my consent to the use of telemedicine in the course of my care at any time, without affecting my right to future care or treatment, and that the Practitioner or I may terminate the telemedicine visit at any time. I understand that I have the right to inspect all information obtained and/or recorded in the course of the telemedicine visit and may receive copies of available information for a reasonable fee.  I understand that some of the potential risks of receiving the Services via telemedicine include:  Delay or interruption in medical evaluation due to technological equipment failure or disruption; Information transmitted may not be sufficient (e.g. poor resolution of images) to allow for appropriate medical decision making by the  Practitioner; and/or  In rare instances, security protocols could fail, causing a breach of personal health information.  Furthermore, I acknowledge that it is my responsibility to provide information about my medical history, conditions and care that is complete and accurate to the best of my ability. I acknowledge that Practitioner's advice, recommendations, and/or decision may be based on factors not within their control, such as incomplete or inaccurate data provided by me or distortions of diagnostic images or specimens that may result from electronic transmissions. I understand that the practice of medicine is not an exact science and that Practitioner makes no warranties or guarantees regarding treatment outcomes. I acknowledge that a copy of this consent can be made available to me via my patient portal [facility] MyChart), or I can request a printed copy by calling the office of [facility].    I understand that my insurance will be billed for this visit.   I have read or had this consent read to me. I understand the contents of this consent, which adequately explains the benefits and risks of the Services being provided via telemedicine.  I have been provided ample opportunity to ask questions regarding this consent and the Services and have had my questions answered to my satisfaction. I give my informed consent for the services to be provided through the use of telemedicine in my medical care

## 2022-07-21 NOTE — Telephone Encounter (Signed)
Patient with diagnosis of atrial fibrillation on Eliquis for anticoagulation.    Procedure: dental implant 1 tooth Date of procedure: TBD   CHA2DS2-VASc Score = 3   This indicates a 3.2% annual risk of stroke. The patient's score is based upon: CHF History: 0 HTN History: 1 Diabetes History: 0 Stroke History: 0 Vascular Disease History: 1 Age Score: 1 Gender Score: 0    CrCl 108 Platelet count 164  Patient does not require pre-op antibiotics for dental procedure.  Per office protocol, patient can hold Eliquis for 1-2 days prior to procedure.   Patient will not need bridging with Lovenox (enoxaparin) around procedure.  **This guidance is not considered finalized until pre-operative APP has relayed final recommendations.**

## 2022-07-21 NOTE — Telephone Encounter (Signed)
   Name: Louis Fuller  DOB: 11-14-1945  MRN: [id number]  Primary Cardiologist: Kristeen Miss, MD   Preoperative team, please contact this patient and set up a phone call appointment for further preoperative risk assessment. Please obtain consent and complete medication review. Thank you for your help.  I confirm that guidance regarding antiplatelet and oral anticoagulation therapy has been completed and, if necessary, noted below.  Pharmacy has provided recommendations for holding anticoagulation.   Ronney Asters, NP 07/21/2022, 9:41 AM [facility]

## 2022-07-21 NOTE — Telephone Encounter (Signed)
Spoke with patient who is agreeable to do a tele visit on 7/2 at 10 am. Patient informed me that his dental implant will be on 7/11. Med rec and consent done.

## 2022-07-29 ENCOUNTER — Ambulatory Visit: Payer: Medicare Other | Attending: Interventional Cardiology

## 2022-07-29 DIAGNOSIS — Z01810 Encounter for preprocedural cardiovascular examination: Secondary | ICD-10-CM | POA: Diagnosis not present

## 2022-07-29 NOTE — Progress Notes (Signed)
Virtual Visit via Telephone Note   Because of Louis Fuller's co-morbid illnesses, he is at least at moderate risk for complications without adequate follow up.  This format is felt to be most appropriate for this patient at this time.  The patient did not have access to video technology/had technical difficulties with video requiring transitioning to audio format only (telephone).  All issues noted in this document were discussed and addressed.  No physical exam could be performed with this format.  Please refer to the patient's chart for his consent to telehealth for [facility].  Evaluation Performed:  Preoperative cardiovascular risk assessment _____________   Date:  07/29/2022   Patient ID:  Louis Fuller, DOB [birth year], MRN [id number] Patient Location:  Home Provider location:   Office  Primary Care Provider:  Rennis Harding, MD Primary Cardiologist:  Kristeen Miss, MD  Chief Complaint / Patient Profile   77 y.o. y/o male with a h/o PAF (on Eliquis), HTN, HLD, arthritis, GERD, PVD who is pending dental implant of 1 tooth and presents today for telephonic preoperative cardiovascular risk assessment.  History of Present Illness    Louis Fuller is a 77 y.o. male who presents via audio/video conferencing for a telehealth visit today.  Pt was last seen in cardiology clinic on 04/01/2022 by Dr. [name]. At that time [name] was doing well with slight increase in weight but blood pressure and rate were both controlled. The patient is now pending procedure as outlined above. Since his last visit, he has been doing well with no new cardiac complaints. Past Medical History    Past Medical History:  Diagnosis Date   Arthritis    Colon polyp    Dysrhythmia    ED (erectile dysfunction)    Excessive drinking of alcohol    Gastritis    GERD (gastroesophageal reflux disease)    Hypercholesterolemia    Hypogonadism in male    consistent with testicular  failure   Longstanding persistent atrial fibrillation (HCC)    Mitral regurgitation    Obesity    Peripheral vascular disease (HCC)    Varicose veins    Past Surgical History:  Procedure Laterality Date   CARDIOVERSION N/A 10/06/2013   Procedure: CARDIOVERSION;  Surgeon: Lewayne Bunting, MD;  Location: [facility] ENDOSCOPY;  Service: Cardiovascular;  Laterality: N/A;   CATARACT EXTRACTION     left   CATARACT EXTRACTION     COLONOSCOPY     COLONOSCOPY WITH PROPOFOL N/A 12/26/2014   Procedure: COLONOSCOPY WITH PROPOFOL;  Surgeon: Christena Deem, MD;  Location: [facility] ENDOSCOPY;  Service: Endoscopy;  Laterality: N/A;   COLONOSCOPY WITH PROPOFOL N/A 03/27/2015   Procedure: COLONOSCOPY WITH PROPOFOL;  Surgeon: Christena Deem, MD;  Location: [facility] ENDOSCOPY;  Service: Endoscopy;  Laterality: N/A;   COLONOSCOPY WITH PROPOFOL N/A 10/22/2016   Procedure: COLONOSCOPY WITH PROPOFOL;  Surgeon: Toledo, Boykin Nearing, MD;  Location: [facility] ENDOSCOPY;  Service: Endoscopy;  Laterality: N/A;   COLONOSCOPY WITH PROPOFOL N/A 06/22/2020   Procedure: COLONOSCOPY WITH PROPOFOL;  Surgeon: Regis Bill, MD;  Location: [facility] ENDOSCOPY;  Service: Endoscopy;  Laterality: N/A;   ENDOVENOUS ABLATION SAPHENOUS VEIN W/ LASER     ESOPHAGOGASTRODUODENOSCOPY (EGD) WITH PROPOFOL N/A 12/26/2014   Procedure: ESOPHAGOGASTRODUODENOSCOPY (EGD) WITH PROPOFOL;  Surgeon: Christena Deem, MD;  Location: [facility] ENDOSCOPY;  Service: Endoscopy;  Laterality: N/A;   EYE SURGERY     HERNIA REPAIR     vein laser surgery  Allergies  No Known Allergies  Home Medications    Prior to Admission medications   Medication Sig Start Date End Date Taking? Authorizing Provider  apixaban (ELIQUIS) 5 MG TABS tablet Take 1 tablet (5 mg total) by mouth 2 (two) times daily. 04/01/22   Nahser, Deloris Ping, MD  atorvastatin (LIPITOR) 40 MG tablet Take 1 tablet (40 mg total) by mouth daily. 04/01/22   Nahser, Deloris Ping, MD  Carboxymethylcellul-Glycerin  (REFRESH OPTIVE OP) Place 1 drop into both eyes daily as needed (for itchy eyes).     [provider]  desonide (DESOWEN) 0.05 % ointment Apply 1 application topically daily as needed. Apply to affected areas topically as needed. 08/02/15   [provider]  gentamicin cream (GARAMYCIN) 0.1 % Apply 1 application  topically 2 (two) times daily. 07/05/21   Felecia Shelling, DPM  metoprolol succinate (TOPROL-XL) 100 MG 24 hr tablet Take 1 tablet (100 mg total) by mouth daily. Take with or immediately following a meal. 04/04/22   Nahser, Deloris Ping, MD  Multiple Vitamin (MULTIVITAMIN) tablet Take 1 tablet by mouth daily.    [provider]  omeprazole (PRILOSEC) 40 MG capsule Take 40 mg by mouth daily.    [provider]  sildenafil (REVATIO) 20 MG tablet Take 20 mg by mouth daily as needed for erectile dysfunction. 06/18/15   [provider]  terbinafine (LAMISIL) 250 MG tablet Take 1 tablet (250 mg total) by mouth daily. 07/19/21   Felecia Shelling, DPM    Physical Exam    Vital Signs:  PARNELL CAEZ does not have vital signs available for review today  Given telephonic nature of communication, physical exam is limited. AAOx3. NAD. Normal affect.  Speech and respirations are unlabored.  Accessory Clinical Findings    None  Assessment & Plan    1.  Preoperative Cardiovascular Risk Assessment:  The patient was advised that if he develops new symptoms prior to surgery to contact our office to arrange for a follow-up visit, and he verbalized understanding.  Patient's RCRI score is 0.4%  The patient affirms he has been doing well without any new cardiac symptoms. They are able to achieve 5 METS without cardiac limitations. Therefore, based on ACC/AHA guidelines, the patient would be at acceptable risk for the planned procedure without further cardiovascular testing. The patient was advised that if he develops new symptoms prior to surgery to contact our office  to arrange for a follow-up visit, and he verbalized understanding.   Per office protocol, patient can hold Eliquis for 1-2 days prior to procedure.     A copy of this note will be routed to requesting surgeon.  Time:   Today, I have spent 8 minutes with the patient with telehealth technology discussing medical history, symptoms, and management plan.     Napoleon Form, Leodis Rains, NP  07/29/2022, 7:07 AM

## 2022-08-04 ENCOUNTER — Telehealth: Payer: Self-pay | Admitting: Cardiovascular Disease

## 2022-08-04 NOTE — Telephone Encounter (Signed)
Follow Up:      Tresa Endo Is calling to check on the status of patient's clearance. Procedure is Thursday(08-07-22). Please fax asap to [phone].

## 2022-08-04 NOTE — Telephone Encounter (Signed)
Sent clearance via Epic free text and manual Fax Preop Fax

## 2022-08-05 NOTE — Telephone Encounter (Signed)
Office didn't received the fax, stated they are having issues. Asking for our office to refax again. Please again

## 2022-08-05 NOTE — Telephone Encounter (Signed)
I will re-fax notes again today.

## 2022-11-28 ENCOUNTER — Encounter (INDEPENDENT_AMBULATORY_CARE_PROVIDER_SITE_OTHER): Payer: Medicare Other | Admitting: Ophthalmology

## 2022-11-28 DIAGNOSIS — H35342 Macular cyst, hole, or pseudohole, left eye: Secondary | ICD-10-CM

## 2022-11-28 DIAGNOSIS — H35372 Puckering of macula, left eye: Secondary | ICD-10-CM

## 2022-11-28 DIAGNOSIS — H43813 Vitreous degeneration, bilateral: Secondary | ICD-10-CM

## 2023-03-05 ENCOUNTER — Other Ambulatory Visit: Payer: Self-pay | Admitting: Cardiovascular Disease

## 2023-03-05 DIAGNOSIS — E66813 Obesity, class 3: Secondary | ICD-10-CM

## 2023-03-05 DIAGNOSIS — E782 Mixed hyperlipidemia: Secondary | ICD-10-CM

## 2023-03-05 DIAGNOSIS — I4819 Other persistent atrial fibrillation: Secondary | ICD-10-CM

## 2023-03-05 DIAGNOSIS — I10 Essential (primary) hypertension: Secondary | ICD-10-CM

## 2023-03-05 MED ORDER — ATORVASTATIN CALCIUM 40 MG PO TABS: 40.0000 mg | ORAL_TABLET | Freq: Every day | ORAL | 0 refills | Status: DC

## 2023-03-05 MED ORDER — METOPROLOL SUCCINATE ER 100 MG PO TB24: 100.0000 mg | ORAL_TABLET | Freq: Every day | ORAL | 0 refills | Status: DC

## 2023-03-05 MED ORDER — APIXABAN 5 MG PO TABS: 5.0000 mg | ORAL_TABLET | Freq: Two times a day (BID) | ORAL | 0 refills | Status: DC

## 2023-03-05 NOTE — Telephone Encounter (Signed)
*  STAT* If patient is at the pharmacy, call can be transferred to refill team.   1. Which medications need to be refilled? (please list name of each medication and dose if known)   atorvastatin  (LIPITOR) 40 MG tablet  metoprolol  succinate (TOPROL -XL) 100 MG 24 hr tablet  apixaban  (ELIQUIS ) 5 MG TABS tablet (almost out)  2. Would you like to learn more about the convenience, safety, & potential cost savings by using the [facility]?   3. Are you open to using the [facility] (Type [facility]. ).   4. Which pharmacy/location (including street and city if local pharmacy) is medication to be sent to?  [facility] - [place], [place] - [address]   5. Do they need a 30 day or 90 day supply?   90 day  Patient stated he is running out of this medication.  Patient has appointment scheduled on 3/7,

## 2023-03-05 NOTE — Telephone Encounter (Signed)
 Prescription refill request for Eliquis  received. Indication: afib  Last office visit: nahser, 04/01/2022 Scr: 0.9, 10/07/2022 Age: 78 yo  Weight: 130 kg   Refill sent.

## 2023-03-10 ENCOUNTER — Ambulatory Visit (INDEPENDENT_AMBULATORY_CARE_PROVIDER_SITE_OTHER): Payer: Medicare Other | Admitting: Nurse Practitioner

## 2023-03-10 ENCOUNTER — Encounter (INDEPENDENT_AMBULATORY_CARE_PROVIDER_SITE_OTHER): Payer: Self-pay | Admitting: Nurse Practitioner

## 2023-03-10 VITALS — BP 133/93 | HR 77 | Resp 16 | Wt 289.4 lb

## 2023-03-10 DIAGNOSIS — K219 Gastro-esophageal reflux disease without esophagitis: Secondary | ICD-10-CM | POA: Diagnosis not present

## 2023-03-10 DIAGNOSIS — E782 Mixed hyperlipidemia: Secondary | ICD-10-CM | POA: Diagnosis not present

## 2023-03-10 DIAGNOSIS — I890 Lymphedema, not elsewhere classified: Secondary | ICD-10-CM | POA: Diagnosis not present

## 2023-03-11 ENCOUNTER — Encounter (INDEPENDENT_AMBULATORY_CARE_PROVIDER_SITE_OTHER): Payer: Self-pay | Admitting: Nurse Practitioner

## 2023-03-11 NOTE — Progress Notes (Signed)
Subjective:    Patient ID: Louis Fuller, male    DOB: November 26, 1945, 78 y.o.   MRN: [id number] Chief Complaint  Patient presents with   Follow-up    14yr follow up    The patient returns to the office for followup evaluation regarding leg swelling.  The swelling has persisted but with the lymph pump is under much, much better controlled. The pain associated with swelling is decreased. There have not been any interval development of a ulcerations or wounds.  The patient denies problems with the pump, noting it is working well and the leggings are in good condition.  Since the previous visit the patient has been wearing graduated compression stockings and using the lymph pump on a routine basis and  has noted significant improvement in the lymphedema.   Patient stated the lymph pump has been helpful with the treatment of the lymphedema.      Review of Systems  Cardiovascular:  Positive for leg swelling.  Skin:  Negative for wound.  All other systems reviewed and are negative.      Objective:   Physical Exam Vitals reviewed.  HENT:     Head: Normocephalic.  Cardiovascular:     Rate and Rhythm: Normal rate.  Pulmonary:     Effort: Pulmonary effort is normal.  Musculoskeletal:     Right lower leg: Edema present.     Left lower leg: Edema present.  Skin:    General: Skin is warm and dry.  Neurological:     Mental Status: He is alert and oriented to person, place, and time.  Psychiatric:        Mood and Affect: Mood normal.        Behavior: Behavior normal.        Thought Content: Thought content normal.        Judgment: Judgment normal.     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   Longstanding persistent atrial fibrillation (HCC)    Mitral regurgitation    Obesity    Peripheral vascular disease (HCC)    Varicose veins     Social History   Socioeconomic History   Marital status: Married    Spouse name: Not on file   Number of children: Not on file   Years of education: Not on file   Highest education level: Not on file  Occupational History   Not on file  Tobacco Use   Smoking status: Never   Smokeless tobacco: Never  Vaping Use   Vaping status: Never Used  Substance and Sexual Activity   Alcohol use: Yes    Alcohol/week: 14.0 standard drinks of alcohol    Types: 14 Cans of beer per week    Comment: 2 beers a day   Drug use: No   Sexual activity: Not on file  Other Topics Concern   Not on file  Social History Narrative   Not on file   Social Drivers of Health   Financial Resource Strain: Low Risk  (10/03/2022)  Received from [facility]   Overall Financial Resource Strain (CARDIA)    Difficulty of Paying Living Expenses: Not very hard  Food Insecurity: No Food Insecurity (10/03/2022)   Received from [facility]   Hunger Vital Sign    Worried About Running Out of Food in the Last Year: Never true    Ran Out of Food in the Last Year: Never true  Transportation Needs: No Transportation Needs (10/03/2022)   Received from [facility] - Transportation    In the past 12 months, has lack of transportation kept you from medical appointments or from getting medications?: No    Lack of Transportation (Non-Medical): No  Physical Activity: Inactive (09/12/2020)   Received from [facility], [facility]   Exercise Vital Sign    Days of Exercise per Week: 0 days    Minutes of Exercise per Session: 0 min  Stress: No Stress Concern Present (09/12/2020)   Received from [facility], [facility]   [employer] of Occupational Health - Occupational Stress Questionnaire    Feeling of Stress : Not at all  Social Connections: Moderately Integrated (09/12/2020)   Received from [facility], [facility]   Social Connection and Isolation Panel [NHANES]    Frequency of Communication with Friends and Family: More than three times a week    Frequency of Social Gatherings with Friends and Family: Once a week    Attends Religious Services: More than 4 times per year    Active Member of [employer] or Organizations: No    Attends Engineer, structural: Patient declined    Marital Status: Married  Catering manager Violence: Not on file    Past Surgical History:  Procedure Laterality Date   CARDIOVERSION N/A 10/06/2013   Procedure: CARDIOVERSION;  Surgeon: Lewayne Bunting, MD;  Location: [facility] ENDOSCOPY;  Service: Cardiovascular;  Laterality: N/A;   CATARACT EXTRACTION     left   CATARACT EXTRACTION     COLONOSCOPY     COLONOSCOPY WITH PROPOFOL N/A 12/26/2014   Procedure: COLONOSCOPY WITH PROPOFOL;  Surgeon: Christena Deem, MD;  Location: [facility] ENDOSCOPY;  Service: Endoscopy;  Laterality: N/A;   COLONOSCOPY WITH PROPOFOL N/A 03/27/2015   Procedure: COLONOSCOPY WITH PROPOFOL;  Surgeon: Christena Deem, MD;  Location: [facility] ENDOSCOPY;  Service: Endoscopy;  Laterality: N/A;   COLONOSCOPY WITH PROPOFOL N/A 10/22/2016   Procedure: COLONOSCOPY WITH PROPOFOL;  Surgeon: Toledo, Boykin Nearing, MD;  Location: [facility] ENDOSCOPY;  Service: Endoscopy;  Laterality: N/A;   COLONOSCOPY WITH PROPOFOL N/A 06/22/2020   Procedure: COLONOSCOPY WITH PROPOFOL;  Surgeon: Regis Bill, MD;  Location: [facility] ENDOSCOPY;  Service: Endoscopy;  Laterality: N/A;   ENDOVENOUS ABLATION SAPHENOUS VEIN W/ LASER     ESOPHAGOGASTRODUODENOSCOPY (EGD) WITH PROPOFOL N/A 12/26/2014   Procedure: ESOPHAGOGASTRODUODENOSCOPY (EGD) WITH PROPOFOL;  Surgeon: Christena Deem,  MD;  Location: [facility] ENDOSCOPY;  Service: Endoscopy;  Laterality: N/A;   EYE SURGERY     HERNIA REPAIR     vein laser surgery      Family History  Family history unknown: Yes    No Known Allergies     Latest Ref Rng & Units 03/27/2015    7:55 AM 09/27/2013   11:54 AM  CBC  WBC 3.8 - 10.6 K/uL 8.6  7.1   Hemoglobin 13.0 - 18.0 g/dL 40.9  81.1   Hematocrit 40.0 -  52.0 % 41.8  43.6   Platelets 150 - 440 K/uL 200  192       CMP     Component Value Date/Time   NA 142 12/06/2018 1150   K 5.1 12/06/2018 1150   CL 102 12/06/2018 1150   CO2 24 12/06/2018 1150   GLUCOSE 84 12/06/2018 1150   GLUCOSE 94 03/13/2015 1152   BUN 12 12/06/2018 1150   CREATININE 0.91 12/06/2018 1150   CREATININE 0.93 03/13/2015 1152   CALCIUM 9.2 12/06/2018 1150   PROT 6.8 07/10/2021 1120   ALBUMIN 4.5 07/10/2021 1120   AST 21 07/10/2021 1120   ALT 28 07/10/2021 1120   ALKPHOS 97 07/10/2021 1120   BILITOT 0.7 07/10/2021 1120   GFR 103.78 12/11/2010 1035   GFRNONAA 85 12/06/2018 1150     No results found.     Assessment & Plan:   1. Lymphedema (Primary) Recommend:  No surgery or intervention at this point in time.    I have reviewed my discussion with the patient regarding lymphedema and why it  causes symptoms.  Patient will continue wearing graduated compression on a daily basis. The patient should put the compression on first thing in the morning and removing them in the evening. The patient should not sleep in the compression.   In addition, behavioral modification throughout the day will be continued.  This will include frequent elevation (such as in a recliner), use of over the counter pain medications as needed and exercise such as walking.  The systemic causes for chronic edema such as liver, kidney and cardiac etiologies does not appear to have significant changed over the past year.    The patient will continue aggressive use of the  lymph pump.  This will continue to improve the  edema control and prevent sequela such as ulcers and infections.   The patient will follow-up with me on an annual basis.   2. Mixed hyperlipidemia Continue statin as ordered and reviewed, no changes at this time  3. Gastroesophageal reflux disease without esophagitis Continue PPI as already ordered, this medication has been reviewed and there are no changes at this time.  Avoidence of caffeine and alcohol  Moderate elevation of the head of the bed    Current Outpatient Medications on File Prior to Visit  Medication Sig Dispense Refill   apixaban (ELIQUIS) 5 MG TABS tablet Take 1 tablet (5 mg total) by mouth 2 (two) times daily. 180 tablet 0   atorvastatin (LIPITOR) 40 MG tablet Take 1 tablet (40 mg total) by mouth daily. 90 tablet 0   Carboxymethylcellul-Glycerin (REFRESH OPTIVE OP) Place 1 drop into both eyes daily as needed (for itchy eyes).      desonide (DESOWEN) 0.05 % ointment Apply 1 application topically daily as needed. Apply to affected areas topically as needed.     gentamicin cream (GARAMYCIN) 0.1 % Apply 1 application  topically 2 (two) times daily. 30 g 1   metoprolol succinate (TOPROL-XL) 100 MG 24 hr tablet Take 1 tablet (100 mg total) by mouth daily. Take with or immediately following a meal. 90 tablet 0   Multiple Vitamin (MULTIVITAMIN) tablet Take 1 tablet by mouth daily.     omeprazole (PRILOSEC) 40 MG capsule Take 40 mg by mouth daily.     sildenafil (REVATIO) 20 MG tablet Take 20 mg by mouth daily as needed for erectile dysfunction.     terbinafine (LAMISIL) 250 MG tablet Take 1 tablet (250 mg total) by mouth daily.  90 tablet 0   No current facility-administered medications on file prior to visit.    There are no Patient Instructions on file for this visit. No follow-ups on file.   Georgiana Spinner, NP

## 2023-03-12 ENCOUNTER — Ambulatory Visit (INDEPENDENT_AMBULATORY_CARE_PROVIDER_SITE_OTHER): Payer: Medicare Other | Admitting: Nurse Practitioner

## 2023-03-27 ENCOUNTER — Ambulatory Visit: Payer: Medicare Other | Admitting: Cardiovascular Disease

## 2023-04-02 ENCOUNTER — Encounter: Payer: Self-pay | Admitting: Cardiovascular Disease

## 2023-04-02 NOTE — Progress Notes (Signed)
 Cardiology Office Note   Date:  04/03/2023   ID:  DRESEAN BECKEL, DOB August 01, 1945, MRN [id number]  PCP:  Rennis Harding, MD  Cardiologist:   Kristeen Miss, MD   Chief Complaint  Patient presents with   Atrial Fibrillation        1. Paroxysmal atrial fibrillation 2. Hypertension 3. Hyperlipidemia 4. Knee arthritis -       Louis Fuller is a 78 y.o. gentleman with a Hx of paroxysmal Atrial Fibrillation.  He notices it significant heart irregularities after he comes in from own grass or doing other yard work. He takes an extra half of of Toprol that seemed to help. He gets some extra exercise  March 04, 2012:  Louis Fuller is doing well.  He has some rare episodes of palpitations that clinically sound like atrial fib.  The episode lasted most of the night.   He takes propranolol on occasion for these episodes of A-Fib.  July 05, 2013:  He has moved to [place] since I last saw him.    He needs to have knee replacements.    He has vericose veins in his legs and has leg swelling.    No CP, no dyspnea.    He is in A-fib today at his office visit.   August 03, 2013:    He is still in atrial fib.  He is basically asymptomatic.  He's able to do all of his normal activities without any problems. He denies any chest pain or shortness of breath.  He has not had his myoview yet.    Echo revealed. Normal left ventricular systolic function and mild mitral regurgitation, trivial aortic insufficiency.    August 23, 2013: Louis Fuller is seen back - he is still A. symptomatically and basically cannot tell that is in atrial fibrillation. He remains in atrial fibrillation. He's able to do all his normal activities without any issues. He's tolerating the Flecainide well. He's on Eliquis and has not noted any bleeding.  Sept. 1, 2015: Louis Fuller is seen  - doing well with current meds.  Ready to have a cardioversion scheduled.  Nov. 3, 2015: Louis Fuller had a successful cardioversion about a month or so ago. Several days  after that he felt that his heart was beating correctly. He is seen today for followup and in fact is back in atrial fibrillation. He continues to be asymptomatic.     Jun 15, 2014: Louis Fuller is a 78 y.o. male who presents for follow-up of his atrial fibrillation. He has been seen by Dr. Johney Frame for consideration for A. fib ablation. Given his lack of symptoms, A. fib ablation is not currently indicated. He declined one to start Tikosyn. His echocardiogram in June, 2015 showed normal left ventricle systolic function.  September 14, 2014:  Doing well.  No symptoms  Limited by knee pain .   Feb. 14, 2017: Doing well from a cardiac standpoint.   Lots of other issues  - was found to have a precancerous lesion on his colon.  Needs to have 4 teeth removed. Will hold his Eliquis for 2 days prior.   Wife, Boyd Kerbs had a mass removed from her head.   Might be an infection .   Sept. 18, 2017:  Doing well.   HR is well controlled.     All other issues are well controlled.   Has colon polyps ,  Gets colonoscopy yearly .   Sept. 11, 2018:  Wt is 249 today  Louis Fuller is  seen for follow up of his chronic Afib. Has mild LV dysfunction by echo in May 2016, mild MR  Has been doing ok Is having a colonocsopy Friday  Will take the last dose of Eliquis on Wednesday .   Sept. 9, 2019: Louis Fuller is seen for his chronic Afib. No cp or dyspnea.   Is not exercising as much as he should  Doing well .   We discussed weight .   Advised weight loss   Nov. 9, 2020  Louis Fuller is seen for pre op eval  For knee surgery .  Overall doing well.  Wife, Boyd Kerbs has has been busy taking care of her mother    12/16/2019: Louis Fuller has not had his knee surgery .  Wt. Is 265. Trying to lose weight .  Eating [place] bacon some  No cp, no dyspnea   Jan. 17, 2023  Louis Fuller is seen for follow up of his atrial fib Wt is 275 lbs  HR is a bit elevated,  its normal at home   Has leg edema .   Legs weep on occasion , does not want to  start a diuretic Advised leg elevation , more ambulation     April 01, 2022 Louis Fuller is seen seen for follow up of his afib, obesity Seen with wife, Boyd Kerbs today   Wt is 286 lbs ( up 9 lbs )   No CP or dyspnea   Labs from his primary medical doctor in the [facility] medical system reveals Total cholesterol is 158 LDL is 71 HDL is 58 Triglyceride levels 146.  Continue current medications.  April 03, 2023 Louis Fuller is seen for follow up of his afib, obesity Wt is  284 lbs - down 2 pounds  Has been to see ortho Needs to work on weight loss to get a BMI below 40 to have his knee replacement   We discussed limited his carbs/    He will be low risk for his knee replacement  He may hold his Eliquis for 2 days prior to his procedure.     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 ENDOSCOPY;  Service: Cardiovascular;  Laterality: N/A;   CATARACT EXTRACTION     left   CATARACT EXTRACTION     COLONOSCOPY     COLONOSCOPY WITH PROPOFOL N/A 12/26/2014   Procedure: COLONOSCOPY WITH PROPOFOL;  Surgeon: Christena Deem, MD;  Location: [facility] ENDOSCOPY;  Service: Endoscopy;  Laterality: N/A;   COLONOSCOPY WITH PROPOFOL N/A 03/27/2015   Procedure: COLONOSCOPY WITH PROPOFOL;  Surgeon: Christena Deem, MD;  Location: [facility] ENDOSCOPY;  Service: Endoscopy;  Laterality: N/A;   COLONOSCOPY WITH PROPOFOL N/A 10/22/2016    Procedure: COLONOSCOPY WITH PROPOFOL;  Surgeon: Toledo, Boykin Nearing, MD;  Location: [facility] ENDOSCOPY;  Service: Endoscopy;  Laterality:  N/A;   COLONOSCOPY WITH PROPOFOL N/A 06/22/2020   Procedure: COLONOSCOPY WITH PROPOFOL;  Surgeon: Regis Bill, MD;  Location: [facility] ENDOSCOPY;  Service: Endoscopy;  Laterality: N/A;   ENDOVENOUS ABLATION SAPHENOUS VEIN W/ LASER     ESOPHAGOGASTRODUODENOSCOPY (EGD) WITH PROPOFOL N/A 12/26/2014   Procedure: ESOPHAGOGASTRODUODENOSCOPY (EGD) WITH PROPOFOL;  Surgeon: Christena Deem, MD;  Location: [facility] ENDOSCOPY;  Service: Endoscopy;  Laterality: N/A;   EYE SURGERY     HERNIA REPAIR     vein laser surgery       Current Outpatient Medications  Medication Sig Dispense Refill   Carboxymethylcellul-Glycerin (REFRESH OPTIVE OP) Place 1 drop into both eyes daily as needed (for itchy eyes).      desonide (DESOWEN) 0.05 % ointment Apply 1 application topically daily as needed. Apply to affected areas topically as needed.     gentamicin cream (GARAMYCIN) 0.1 % Apply 1 application  topically 2 (two) times daily. 30 g 1   Multiple Vitamin (MULTIVITAMIN) tablet Take 1 tablet by mouth daily.     sildenafil (REVATIO) 20 MG tablet Take 20 mg by mouth daily as needed for erectile dysfunction.     terbinafine (LAMISIL) 250 MG tablet Take 1 tablet (250 mg total) by mouth daily. 90 tablet 0   apixaban (ELIQUIS) 5 MG TABS tablet Take 1 tablet (5 mg total) by mouth 2 (two) times daily. 180 tablet 0   atorvastatin (LIPITOR) 40 MG tablet Take 1 tablet (40 mg total) by mouth daily. 90 tablet 0   metoprolol succinate (TOPROL-XL) 100 MG 24 hr tablet Take 1 tablet (100 mg total) by mouth daily. Take with or immediately following a meal. 90 tablet 0   omeprazole (PRILOSEC) 40 MG capsule Take 1 capsule (40 mg total) by mouth daily. 90 capsule 3   No current facility-administered medications for this visit.    Allergies:   Patient has no known allergies.    Social History:  The  patient  reports that he has never smoked. He has never used smokeless tobacco. He reports current alcohol use of about 14.0 standard drinks of alcohol per week. He reports that he does not use drugs.   Family History:  The patient's Family history is unknown by patient.    ROS:  Please see the history of present illness.      Physical Exam: Blood pressure 118/84, pulse 94, height 5\' 10"  (1.778 m), weight 284 lb (128.8 kg), SpO2 94%.       GEN:   morbidly obese male, in no acute distress HEENT: Normal NECK: No JVD; No carotid bruits LYMPHATICS: No lymphadenopathy CARDIAC: irreg. Irreg  no murmurs, rubs, gallops RESPIRATORY:  Clear to auscultation without rales, wheezing or rhonchi  ABDOMEN: Soft, non-tender, non-distended MUSCULOSKELETAL:  No edema; No deformity  SKIN: Warm and dry NEUROLOGIC:  Alert and oriented x 3    EKG:        EKG Interpretation Date/Time:  Friday April 03 2023 13:34:26 EST Ventricular Rate:  88 PR Interval:    QRS Duration:  84 QT Interval:  372 QTC Calculation: 450 R Axis:   -12  Text Interpretation: Atrial fibrillation with premature ventricular or aberrantly conducted complexes Nonspecific ST abnormality When compared with ECG of 06-Oct-2013 11:27, Atrial fibrillation has replaced Sinus rhythm Non-specific change in ST segment in Inferior leads Confirmed by Kristeen Miss ([id number]) on 04/03/2023 1:39:07 PM     Recent Labs: No results found for requested labs within last 365 days.    Lipid  Panel    Component Value Date/Time   CHOL 163 12/06/2018 1150   TRIG 90 12/06/2018 1150   HDL 65 12/06/2018 1150   CHOLHDL 2.5 12/06/2018 1150   CHOLHDL 4.4 03/13/2015 1152   VLDL 33 (H) 03/13/2015 1152   LDLCALC 81 12/06/2018 1150       Wt Readings from Last 3 Encounters:  04/03/23 284 lb (128.8 kg)  03/10/23 289 lb 6.4 oz (131.3 kg)  04/01/22 286 lb 12.8 oz (130.1 kg)      Other studies Reviewed: Additional studies/ records that were  reviewed today include: . Review of the above records demonstrates:    ASSESSMENT AND PLAN:  1. Persistent atrial fibrillation -his A-fib rate is well-controlled.  Continue Eliquis.  2. Hypertension -blood pressure is well-controlled.  3. Hyperlipidemia -   his lipids have been managed by his primary medical doctor.  4.  Knee osteoarthritis: He needs to have a knee replacement.  His doctor would like for his BMI to be less than 40.  Advised him to work on carbohydrate reduction.   Current medicines are reviewed at length with the patient today.  The patient does not have concerns regarding medicines.  The following changes have been made:  no change  Labs/ tests ordered today include:   Orders Placed This Encounter  Procedures   EKG 12-Lead   Disposition:   FU with our [place] office in a year        Kristeen Miss, MD  04/03/2023 1:39 PM    [facility] [address] [place], [place], [place]  [address] Phone: [phone]; Fax: [phone]

## 2023-04-03 ENCOUNTER — Ambulatory Visit: Payer: Medicare Other | Attending: Cardiovascular Disease | Admitting: Cardiovascular Disease

## 2023-04-03 ENCOUNTER — Encounter: Payer: Self-pay | Admitting: Cardiovascular Disease

## 2023-04-03 VITALS — BP 118/84 | HR 94 | Ht 69.0 in | Wt 284.0 lb

## 2023-04-03 DIAGNOSIS — I10 Essential (primary) hypertension: Secondary | ICD-10-CM | POA: Insufficient documentation

## 2023-04-03 DIAGNOSIS — I4819 Other persistent atrial fibrillation: Secondary | ICD-10-CM | POA: Insufficient documentation

## 2023-04-03 DIAGNOSIS — Z6841 Body Mass Index (BMI) 40.0 and over, adult: Secondary | ICD-10-CM | POA: Diagnosis present

## 2023-04-03 DIAGNOSIS — E66813 Obesity, class 3: Secondary | ICD-10-CM | POA: Insufficient documentation

## 2023-04-03 DIAGNOSIS — E782 Mixed hyperlipidemia: Secondary | ICD-10-CM | POA: Diagnosis present

## 2023-04-03 MED ORDER — APIXABAN 5 MG PO TABS: 5.0000 mg | ORAL_TABLET | Freq: Two times a day (BID) | ORAL | 0 refills | Status: DC

## 2023-04-03 MED ORDER — ATORVASTATIN CALCIUM 40 MG PO TABS: 40.0000 mg | ORAL_TABLET | Freq: Every day | ORAL | 0 refills | Status: DC

## 2023-04-03 MED ORDER — OMEPRAZOLE 40 MG PO CPDR: 40.0000 mg | DELAYED_RELEASE_CAPSULE | Freq: Every day | ORAL | 3 refills | Status: AC

## 2023-04-03 MED ORDER — METOPROLOL SUCCINATE ER 100 MG PO TB24: 100.0000 mg | ORAL_TABLET | Freq: Every day | ORAL | 0 refills | Status: DC

## 2023-04-03 NOTE — Patient Instructions (Signed)
 Follow-Up: At [facility], you and your health needs are our priority.  As part of our continuing mission to provide you with exceptional heart care, we have created designated Provider Care Teams.  These Care Teams include your primary Cardiologist (physician) and Advanced Practice Providers (APPs -  Physician Assistants and Nurse Practitioners) who all work together to provide you with the care you need, when you need it.  We recommend signing up for the patient portal called "MyChart".  Sign up information is provided on this After Visit Summary.  MyChart is used to connect with patients for Virtual Visits (Telemedicine).  Patients are able to view lab/test results, encounter notes, upcoming appointments, etc.  Non-urgent messages can be sent to your provider as well.   To learn more about what you can do with MyChart, go to [email or web address].    Your next appointment:   1 year(s)  Provider:   You may see one of the following Advanced Practice Providers on your designated Care Team:   Nicolasa Ducking, NP Eula Listen, PA-C Cadence Fransico Michael, PA-C Charlsie Quest, NP Carlos Levering, NP

## 2023-05-12 ENCOUNTER — Other Ambulatory Visit: Payer: Self-pay | Admitting: Cardiovascular Disease

## 2023-05-12 DIAGNOSIS — E782 Mixed hyperlipidemia: Secondary | ICD-10-CM

## 2023-05-12 DIAGNOSIS — E66813 Obesity, class 3: Secondary | ICD-10-CM

## 2023-05-12 DIAGNOSIS — I10 Essential (primary) hypertension: Secondary | ICD-10-CM

## 2023-05-12 DIAGNOSIS — I4819 Other persistent atrial fibrillation: Secondary | ICD-10-CM

## 2023-05-12 MED ORDER — ATORVASTATIN CALCIUM 40 MG PO TABS: 40.0000 mg | ORAL_TABLET | Freq: Every day | ORAL | 3 refills | Status: AC

## 2023-05-12 MED ORDER — METOPROLOL SUCCINATE ER 100 MG PO TB24: 100.0000 mg | ORAL_TABLET | Freq: Every day | ORAL | 3 refills | Status: AC

## 2023-05-12 MED ORDER — APIXABAN 5 MG PO TABS: 5.0000 mg | ORAL_TABLET | Freq: Two times a day (BID) | ORAL | 3 refills | Status: AC

## 2023-05-12 NOTE — Telephone Encounter (Signed)
 Pt's medications were sent to pt's pharmacy as requested. Confirmation received.

## 2023-05-12 NOTE — Telephone Encounter (Signed)
 Prescription refill request for Eliquis received. Indication:afib Last office visit:3/25 Scr:0.9  9/24 Age: 78 Weight:128.8  kg  Prescription refilled

## 2023-05-12 NOTE — Telephone Encounter (Signed)
*  STAT* If patient is at the pharmacy, call can be transferred to refill team.   1. Which medications need to be refilled? (please list name of each medication and dose if known) apixaban (ELIQUIS) 5 MG TABS tablet   atorvastatin (LIPITOR) 40 MG tablet   metoprolol succinate (TOPROL-XL) 100 MG 24 hr tablet   4. Which pharmacy/location (including street and city if local pharmacy) is medication to be sent to?  [facility] - [place], [place] - [address]     5. Do they need a 30 day or 90 day supply? 90   Pt would like 90 day with 3 refills.

## 2023-11-30 ENCOUNTER — Encounter (INDEPENDENT_AMBULATORY_CARE_PROVIDER_SITE_OTHER): Payer: Medicare Other | Admitting: Ophthalmology

## 2023-11-30 DIAGNOSIS — H35342 Macular cyst, hole, or pseudohole, left eye: Secondary | ICD-10-CM

## 2023-11-30 DIAGNOSIS — H43813 Vitreous degeneration, bilateral: Secondary | ICD-10-CM

## 2023-11-30 DIAGNOSIS — H35372 Puckering of macula, left eye: Secondary | ICD-10-CM

## 2024-03-10 ENCOUNTER — Ambulatory Visit (INDEPENDENT_AMBULATORY_CARE_PROVIDER_SITE_OTHER): Payer: Medicare Other | Admitting: Vascular Surgery

## 2024-04-04 ENCOUNTER — Ambulatory Visit

## 2024-04-20 ENCOUNTER — Ambulatory Visit

## 2024-11-30 ENCOUNTER — Encounter (INDEPENDENT_AMBULATORY_CARE_PROVIDER_SITE_OTHER): Admitting: Ophthalmology
